# Patient Record
Sex: Male | Born: 1965
Health system: Southern US, Community
[De-identification: ages and names within clinical notes are randomized; demographics above are authoritative.]

## PROBLEM LIST (undated history)

## (undated) DIAGNOSIS — E785 Hyperlipidemia, unspecified: Secondary | ICD-10-CM

## (undated) DIAGNOSIS — R7309 Other abnormal glucose: Secondary | ICD-10-CM

## (undated) DIAGNOSIS — Z87442 Personal history of urinary calculi: Secondary | ICD-10-CM

## (undated) DIAGNOSIS — F329 Major depressive disorder, single episode, unspecified: Secondary | ICD-10-CM

## (undated) DIAGNOSIS — I10 Essential (primary) hypertension: Secondary | ICD-10-CM

## (undated) DIAGNOSIS — K219 Gastro-esophageal reflux disease without esophagitis: Secondary | ICD-10-CM

## (undated) DIAGNOSIS — F419 Anxiety disorder, unspecified: Secondary | ICD-10-CM

## (undated) DIAGNOSIS — N200 Calculus of kidney: Secondary | ICD-10-CM

## (undated) HISTORY — DX: Personal history of urinary calculi: Z87.442

## (undated) HISTORY — PX: CYSTOSCOPY W/ URETERAL STENT PLACEMENT: SHX1429

## (undated) HISTORY — DX: Hyperlipidemia, unspecified: E78.5

## (undated) HISTORY — DX: Other abnormal glucose: R73.09

## (undated) HISTORY — DX: Major depressive disorder, single episode, unspecified: F32.9

## (undated) HISTORY — PX: OTHER SURGICAL HISTORY: SHX169

## (undated) HISTORY — PX: WISDOM TOOTH EXTRACTION: SHX21

## (undated) HISTORY — DX: Essential (primary) hypertension: I10

## (undated) HISTORY — PX: LITHOTRIPSY: SUR834

## (undated) HISTORY — DX: Gastro-esophageal reflux disease without esophagitis: K21.9

## (undated) HISTORY — DX: Anxiety disorder, unspecified: F41.9

---
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
## 2009-09-08 ENCOUNTER — Ambulatory Visit: Payer: Self-pay | Admitting: Family Medicine

## 2009-09-08 DIAGNOSIS — F3289 Other specified depressive episodes: Secondary | ICD-10-CM

## 2009-09-08 DIAGNOSIS — F329 Major depressive disorder, single episode, unspecified: Secondary | ICD-10-CM

## 2009-09-08 DIAGNOSIS — I10 Essential (primary) hypertension: Secondary | ICD-10-CM | POA: Insufficient documentation

## 2009-09-08 DIAGNOSIS — Z87442 Personal history of urinary calculi: Secondary | ICD-10-CM | POA: Insufficient documentation

## 2009-09-08 DIAGNOSIS — E785 Hyperlipidemia, unspecified: Secondary | ICD-10-CM | POA: Insufficient documentation

## 2009-09-08 DIAGNOSIS — K219 Gastro-esophageal reflux disease without esophagitis: Secondary | ICD-10-CM | POA: Insufficient documentation

## 2009-09-08 DIAGNOSIS — F339 Major depressive disorder, recurrent, unspecified: Secondary | ICD-10-CM | POA: Insufficient documentation

## 2009-09-08 DIAGNOSIS — R7303 Prediabetes: Secondary | ICD-10-CM | POA: Insufficient documentation

## 2009-09-08 DIAGNOSIS — R7309 Other abnormal glucose: Secondary | ICD-10-CM

## 2009-09-08 HISTORY — DX: Essential (primary) hypertension: I10

## 2009-09-08 HISTORY — DX: Personal history of urinary calculi: Z87.442

## 2009-09-08 HISTORY — DX: Gastro-esophageal reflux disease without esophagitis: K21.9

## 2009-09-08 HISTORY — DX: Other specified depressive episodes: F32.89

## 2009-09-08 HISTORY — DX: Hyperlipidemia, unspecified: E78.5

## 2009-09-08 HISTORY — DX: Other abnormal glucose: R73.09

## 2009-09-08 HISTORY — DX: Major depressive disorder, single episode, unspecified: F32.9

## 2009-09-10 ENCOUNTER — Ambulatory Visit: Payer: Self-pay | Admitting: Family Medicine

## 2009-10-01 ENCOUNTER — Ambulatory Visit: Payer: Self-pay | Admitting: Family Medicine

## 2009-10-05 LAB — CONVERTED CEMR LAB
ALT: 36 units/L (ref 0–53)
AST: 41 units/L — ABNORMAL HIGH (ref 0–37)
Albumin: 4.2 g/dL (ref 3.5–5.2)
Alkaline Phosphatase: 64 units/L (ref 39–117)
Cholesterol: 254 mg/dL — ABNORMAL HIGH (ref 0–200)
Total CHOL/HDL Ratio: 7
Triglycerides: 515 mg/dL — ABNORMAL HIGH (ref 0.0–149.0)

## 2009-11-06 ENCOUNTER — Telehealth: Payer: Self-pay | Admitting: Family Medicine

## 2010-01-13 ENCOUNTER — Ambulatory Visit: Payer: Self-pay | Admitting: Family Medicine

## 2010-01-14 LAB — CONVERTED CEMR LAB
Albumin: 4.2 g/dL (ref 3.5–5.2)
Alkaline Phosphatase: 64 units/L (ref 39–117)
HDL: 28.7 mg/dL — ABNORMAL LOW (ref >39.00)
LDL Cholesterol: 82 mg/dL (ref 0–99)
Total Bilirubin: 0.7 mg/dL (ref 0.3–1.2)
Total CHOL/HDL Ratio: 5
Triglycerides: 189 mg/dL — ABNORMAL HIGH (ref 0.0–149.0)

## 2010-06-15 NOTE — Assessment & Plan Note (Signed)
Summary: NEW TO EST//CCM   Vital Signs:  Patient profile:   45 year old Corey [name] Height:      71 inches Weight:      208 pounds BMI:     29.11 Temp:     98.6 degrees F oral Pulse rate:   76 / minute Resp:     14 per minute BP sitting:   140 / 90  (left arm)  Vitals Entered By: Willy Eddy, LPN (September 08, 2009 10:23 AM)  Nutrition Counseling: Patient's BMI is greater than 25 and therefore counseled on weight management options.  Serial Vital Signs/Assessments:  Time      Position  BP       Pulse  Resp  Temp     By                     122/72                         [name] MD  CC: new to establish-was gioingto eagle   History of Present Illness: New patient to establish care.  History hypertension treated with benazepril 20 mg daily. Blood pressure well-controlled by home readings. No dizziness or palpitations recently.  History of dyslipidemia with high triglycerides. Treated with Lovaza. No history of diabetes.  History of GERD controlled with lifestyle measures. Occasionally supplements with over-the-counter antacids. Symptoms stable.  History of depression and reported generalized anxiety disorder. Treated Lexapro and doing well. Remote history of kidney stones.  also reported history of prediabetes by previous labs last year with no recent followup  family history and social history reviewed and is recorded elsewhere    Preventive Screening-Counseling & Management  Alcohol-Tobacco     Smoking Status: never  Caffeine-Diet-Exercise     Does Patient Exercise: yes      Drug Use:  no.    Past History:  Family History: Last updated: 09/08/2009 Family History High cholesterol Family History of CAD Father 41s Hx CVA grandfather  Social History: Last updated: 09/08/2009 Occupation:music teacher Single Never Smoked Alcohol use-yes Drug use-no Regular exercise-yes  Risk Factors: Exercise: yes (09/08/2009)  Risk Factors: Smoking Status: never  (09/08/2009)  Past Medical History: Depression GERD Hyperlipidemia Hypertension Nephrolithiasis, hx of  Family History: Family History High cholesterol Family History of CAD Father 79s Hx CVA grandfather  Social History: Chief Financial Officer Single Never Smoked Alcohol use-yes Drug use-no Regular exercise-yes Occupation:  employed Smoking Status:  never Drug Use:  no Does Patient Exercise:  yes  Review of Systems  The patient denies anorexia, fever, weight loss, weight gain, vision loss, decreased hearing, chest pain, syncope, dyspnea on exertion, peripheral edema, prolonged cough, headaches, hemoptysis, abdominal pain, melena, hematochezia, and severe indigestion/heartburn.    Physical Exam  General:  Well-developed,well-nourished,in no acute distress; alert,appropriate and cooperative throughout examination Ears:  External ear exam shows no significant lesions or deformities.  Otoscopic examination reveals clear canals, tympanic membranes are intact bilaterally without bulging, retraction, inflammation or discharge. Hearing is grossly normal bilaterally. Mouth:  Oral mucosa and oropharynx without lesions or exudates.  Teeth in good repair. Neck:  No deformities, masses, or tenderness noted. Lungs:  Normal respiratory effort, chest expands symmetrically. Lungs are clear to auscultation, no crackles or wheezes. Heart:  Normal rate and regular rhythm. S1 and S2 normal without gallop, murmur, click, rub or other extra sounds. Extremities:  No clubbing, cyanosis, edema, or deformity noted with normal full range of  motion of all joints.     Impression & Recommendations:  Problem # 1:  HYPERTENSION (ICD-401.9) stable.  Refill medication for one year. His updated medication list for this problem includes:    Lotensin 20 Mg Tabs (Benazepril hcl) .Marland Kitchen... 1 once daily  Problem # 2:  HYPERLIPIDEMIA (ICD-272.4) discussed exercise and wt loss and schedule labs. His updated  medication list for this problem includes:    Lovaza 1 Gm Caps (Omega-3-acid ethyl esters) .Marland Kitchen... 2 every am  Problem # 3:  GERD (ICD-530.81) Assessment: Unchanged  Problem # 4:  DEPRESSION (ICD-311) symptoms stable. His updated medication list for this problem includes:    Lexapro 10 Mg Tabs (Escitalopram oxalate) .Marland Kitchen... 1 once daily  Problem # 5:  PREDIABETES (ICD-790.29) by history.  Reassess fasting glucose.  Complete Medication List: 1)  Lexapro 10 Mg Tabs (Escitalopram oxalate) .Marland Kitchen.. 1 once daily 2)  Lotensin 20 Mg Tabs (Benazepril hcl) .Marland Kitchen.. 1 once daily 3)  Lovaza 1 Gm Caps (Omega-3-acid ethyl esters) .... 2 every am  Patient Instructions: 1)  Schedule the following labs: 2)  Lipid  272.4 3)  Hepatic 272.4 4)  Glucose  790.29 5)  It is important that you exercise reguarly at least 20 minutes 5 times a week. If you develop chest pain, have severe difficulty breathing, or feel very tired, stop exercising immediately and seek medical attention.  6)  You need to lose weight. Consider a lower calorie diet and regular exercise.  Prescriptions: LOTENSIN 20 MG TABS (BENAZEPRIL HCL) 1 once daily  #90 x 3   Entered and Authorized by:   [name] MD   Signed by:   [name] MD on 09/08/2009   Method used:   Electronically to        [facility]  [phone]* (retail)       [address] [place], [place]  [address]       Ph: [phone] or [phone]       Fax: [phone]   RxID:   [id number]    Immunization History:  Tetanus/Td Immunization History:    Tetanus/Td:  historical (05/16/2001)

## 2010-06-15 NOTE — Progress Notes (Signed)
Summary: Pt discontinue Simvastatin due to side effects he's experiencing  Phone Note Call from Patient Call back at Home Phone [phone]   Caller: Patient Summary of Call: Pt called and said that he is having side effects from taking Simvastatin.  Pt feels very weak and dizzy when taking this medication. Pt has discontinued med 4 days ago and is wanting to know if there is an alternative? Initial call taken by: Lucy Antigua,  November 06, 2009 3:08 PM  Follow-up for Phone Call        start Lipitor 10 mg by mouth once daily and repeat lipids and hepatic in 2 months. Follow-up by: [name] MD,  November 06, 2009 6:10 PM  Additional Follow-up for Phone Call Additional follow up Details #1::        Pt informed, he will call for lab visit in 2 months Additional Follow-up by: Sid Falcon LPN,  November 09, 2009 1:39 PM    New/Updated Medications: LIPITOR 10 MG TABS (ATORVASTATIN CALCIUM) once daily Prescriptions: LIPITOR 10 MG TABS (ATORVASTATIN CALCIUM) once daily  #30 x 3   Entered by:   Sid Falcon LPN   Authorized by:   [name] MD   Signed by:   Sid Falcon LPN on 30/86/5784   Method used:   Electronically to        [facility]  [phone]* (retail)       [address] [place], [place]  [address]       Ph: [phone] or [phone]       Fax: [phone]   RxID:   [id number]

## 2010-06-15 NOTE — Assessment & Plan Note (Signed)
Summary: rash on face/blisters on genitals/cjr   Vital Signs:  Patient profile:   45 year old male Temp:     99.2 degrees F oral BP sitting:   160 / 102  (left arm) Cuff size:   regular  Vitals Entered By: Sid Falcon LPN (September 10, 2009 2:56 PM) CC: Rash on face, genitals   History of Present Illness: Acute visit for pruritic rash right face, right hand and penis. This started couple days ago. Had been working with some plants outdoors. Prior history of contact dermatitis. No associated pain. No fevers or chills. Has not tried anything for alleviating.  Allergies (verified): No Known Drug Allergies  Past History:  Past Medical History: Last updated: 09/08/2009 Depression GERD Hyperlipidemia Hypertension Nephrolithiasis, hx of  Review of Systems  The patient denies fever and headaches.    Physical Exam  General:  Well-developed,well-nourished,in no acute distress; alert,appropriate and cooperative throughout examination Skin:  patient has rash in patch right face, right middle finger, and penis all similar appearance with erythematous base and vesicular surface. Nontender.   Impression & Recommendations:  Problem # 1:  RHUS DERMATITIS (ICD-692.6)  write for prednisone taper.  Follow up if signs of secondary infection.  His updated medication list for this problem includes:    Prednisone 10 Mg Tabs (Prednisone) .Marland Kitchen... Taper as follows:  6-5-4-4-4-3-3-2-2-1-1  Complete Medication List: 1)  Lexapro 10 Mg Tabs (Escitalopram oxalate) .Marland Kitchen.. 1 once daily 2)  Lotensin 20 Mg Tabs (Benazepril hcl) .Marland Kitchen.. 1 once daily 3)  Lovaza 1 Gm Caps (Omega-3-acid ethyl esters) .... 2 every am 4)  Prednisone 10 Mg Tabs (Prednisone) .... Taper as follows:  6-5-4-4-4-3-3-2-2-1-1  Patient Instructions: 1)  touch base if rash not resolving over the next couple of weeks Prescriptions: PREDNISONE 10 MG TABS (PREDNISONE) taper as follows:  6-5-4-4-4-3-3-2-2-1-1  #35 x 0   Entered and  Authorized by:   [name] MD   Signed by:   [name] MD on 09/10/2009   Method used:   Print then Give to Patient   RxID:   [id number]

## 2010-07-20 ENCOUNTER — Other Ambulatory Visit: Payer: Self-pay | Admitting: Family Medicine

## 2010-09-17 ENCOUNTER — Other Ambulatory Visit: Payer: Self-pay | Admitting: Family Medicine

## 2010-10-20 ENCOUNTER — Other Ambulatory Visit (INDEPENDENT_AMBULATORY_CARE_PROVIDER_SITE_OTHER): Payer: 59

## 2010-10-20 DIAGNOSIS — T887XXA Unspecified adverse effect of drug or medicament, initial encounter: Secondary | ICD-10-CM

## 2010-10-20 DIAGNOSIS — E785 Hyperlipidemia, unspecified: Secondary | ICD-10-CM

## 2010-10-20 LAB — HEPATIC FUNCTION PANEL
ALT: 32 U/L (ref 0–53)
AST: 22 U/L (ref 0–37)
Alkaline Phosphatase: 81 U/L (ref 39–117)
Total Bilirubin: 0.2 mg/dL — ABNORMAL LOW (ref 0.3–1.2)

## 2010-10-20 LAB — LIPID PANEL
Total CHOL/HDL Ratio: 6
VLDL: 121.6 mg/dL — ABNORMAL HIGH (ref 0.0–40.0)

## 2010-10-21 NOTE — Progress Notes (Signed)
Quick Note:  Pt informed on VM ______ 

## 2010-10-27 ENCOUNTER — Encounter: Payer: Self-pay | Admitting: Family Medicine

## 2010-10-28 ENCOUNTER — Encounter: Payer: Self-pay | Admitting: Family Medicine

## 2010-10-28 ENCOUNTER — Ambulatory Visit (INDEPENDENT_AMBULATORY_CARE_PROVIDER_SITE_OTHER): Payer: 59 | Admitting: Family Medicine

## 2010-10-28 VITALS — BP 120/88 | HR 72 | Temp 98.6°F | Resp 12 | Ht 68.5 in | Wt 211.0 lb

## 2010-10-28 DIAGNOSIS — Z299 Encounter for prophylactic measures, unspecified: Secondary | ICD-10-CM

## 2010-10-28 DIAGNOSIS — E785 Hyperlipidemia, unspecified: Secondary | ICD-10-CM

## 2010-10-28 LAB — GLUCOSE, POCT (MANUAL RESULT ENTRY): POC Glucose: 94

## 2010-10-28 MED ORDER — ATORVASTATIN CALCIUM 10 MG PO TABS: 10.0000 mg | ORAL_TABLET | Freq: Every day | ORAL | Status: DC

## 2010-10-28 MED ORDER — TETANUS-DIPHTH-ACELL PERTUSSIS 5-2.5-18.5 LF-MCG/0.5 IM SUSP: 0.5000 mL | Freq: Once | INTRAMUSCULAR | Status: DC

## 2010-10-28 NOTE — Progress Notes (Signed)
  Subjective:    Patient ID: Corey [name], male    DOB: 1966-02-08, 45 y.o.   MRN: [id number]  HPI Patient seen for complete physical examination. His medical problems include history of hyperlipidemia, anxiety, hypertension, GERD, and reported prediabetes. Probable metabolic syndrome with high triglyceride, low HDL, hypertension, and possible insulin resistance. No family history of premature CAD.  Patient nonsmoker. No regular alcohol use. No regular exercise. Last tetanus 2004. No history of Tdap.  Review of Systems  Constitutional: Negative for fever, activity change, appetite change and fatigue.  HENT: Negative for ear pain, congestion and trouble swallowing.   Eyes: Negative for pain and visual disturbance.  Respiratory: Negative for cough, shortness of breath and wheezing.   Cardiovascular: Negative for chest pain and palpitations.  Gastrointestinal: Negative for nausea, vomiting, abdominal pain, diarrhea, constipation, blood in stool, abdominal distention and rectal pain.  Genitourinary: Negative for dysuria, hematuria and testicular pain.  Musculoskeletal: Negative for joint swelling and arthralgias.  Skin: Negative for rash.  Neurological: Negative for dizziness, syncope and headaches.  Hematological: Negative for adenopathy.  Psychiatric/Behavioral: Negative for confusion and dysphoric mood.       Objective:   Physical Exam  Constitutional: He is oriented to person, place, and time. He appears well-developed and well-nourished. No distress.  HENT:  Head: Normocephalic and atraumatic.  Right Ear: External ear normal.  Left Ear: External ear normal.  Mouth/Throat: Oropharynx is clear and moist.  Eyes: Conjunctivae and EOM are normal. Pupils are equal, round, and reactive to light.  Neck: Normal range of motion. Neck supple. No thyromegaly present.  Cardiovascular: Normal rate, regular rhythm and normal heart sounds.   No murmur heard. Pulmonary/Chest: No respiratory  distress. He has no wheezes. He has no rales.  Abdominal: Soft. Bowel sounds are normal. He exhibits no distension and no mass. There is no tenderness. There is no rebound and no guarding.  Genitourinary:       No testicle mass or hernia  Musculoskeletal: He exhibits no edema.  Lymphadenopathy:    He has no cervical adenopathy.  Neurological: He is alert and oriented to person, place, and time. He displays normal reflexes. No cranial nerve deficit.  Skin: No rash noted.  Psychiatric: He has a normal mood and affect.          Assessment & Plan:  Complete physical. Labs reviewed with patient. Work on weight loss. Establish more consistent exercise. Consider omega-3 supplement

## 2010-10-28 NOTE — Patient Instructions (Addendum)
Metabolic Syndrome - Adult "Metabolic syndrome" is a term that refers to a group of risk factors for heart disease and diabetes. This syndrome has other names including Insulin Resistance Syndrome. The more risk factors you have, the higher your risk of having a heart attack, stroke, or developing diabetes. These risk factors include:  High blood sugar.   High blood triglyceride (a fat found in the blood) level.   High blood pressure.   Abdominal obesity (your extra weight is around your waist instead of your hips).   Low levels of HDL ("good" blood cholesterol).  If you have any three of these risk factors, you have "metabolic syndrome." If you have even one of these factors, you should make lifestyle changes to improve your health in order to prevent serious health diseases.  In people with "metabolic syndrome or insulin resistance," the cells do not respond properly to insulin. This can lead to high levels of glucose in the blood, which can interfere with normal body processes. Eventually, this can cause high blood pressure and higher fat levels in the blood, and inflammation of your blood vessels. The result can be heart disease and stroke.  CAUSES  Eating a diet rich in calories and saturated fat.   Too little physical activity.   Being overweight.  Other underlying causes are:  Family history (genetics).   Ethnicity (South Asians are at a higher risk).   Older age (your chances of developing metabolic syndrome are higher as you grow older).   Insulin resistance (see above).  SYMPTOMS By itself, metabolic syndrome has no symptoms. However, you might have symptoms of diabetes (high blood sugar) or high blood pressure, such as:  Increased thirst, urination, and tiredness.   Dizzy spells.   Dull headaches that are unusual for you.   Blurred vision.   Nosebleeds.  DIAGNOSIS Your caregiver may make a diagnosis of metabolic syndrome if you have at least three of these  factors:  If you are overweight mostly around the waist. This means a waistline greater than 40" in men and more than 35" in women. The waistline limits are 31-35 inches for women and 37-39 inches for men. In those who have certain genetic risk factors, such as having a family history of diabetes or being of Asian descent.   If you have a blood pressure of 130/85 mm Hg or more, or if you are being treated for high blood pressure.   If your blood triglyceride level is 150 mg/dL or more, or you are being treated for high levels of triglyceride.   If the level of HDL (high-density lipoprotein - "good" cholesterol) in your blood is below 40 mg/dL in men, less than 50 mg/dL in women, or you are receiving treatment for low levels of high-density lipoprotein.   If the level of sugar in your blood is high with fasting blood sugar level of 110 mg/dL or more, or you are under treatment for diabetes.  TREATMENT Your caregiver may advise lifestyle changes along with medications. Being more physically active, losing weight, changes in everyday diet, eating less food and not smoking are the key changes needed to reduce your risk for heart disease and stroke. Medicines may also be prescribed by your caregiver to help your body respond to insulin better and to reduce your blood pressure and blood fat levels. Aspirin may be recommended to reduce risks of heart disease or stroke.   Exercise: 30 minutes of brisk walking daily is recommended. An exercise program can  be designed around physical limitations and/or medical problems.   Losing weight: This can help to reduce sugar levels in your blood, lower your blood pressure, and decrease your risk of diabetes.   Healthy eating: Eating the right type of food and reducing the amount of food that is eaten everyday is important. The goal is to reduce unhealthy fats and increasing the amount of fruits, vegetables, fish, and whole grains in your diet. This helps to reduce  your risks.   Stop smoking: Your caregiver will likely advise you to stop smoking. Smoking increases your body's resistance to insulin and has other adverse health consequences.  HOME CARE INSTRUCTIONS  Permanent changes in what you eat and how much you eat everyday will likely lead to weight loss. Regular exercise as noted above.   Measure your waist at regular intervals just above the hipbones after you have breathed out.   Eat fruits, such as apples, oranges, and pears.   Eat vegetables.   Eat legumes, such as kidney beans, peas, and lentils.   Eat food rich in soluble fiber, such as whole grain cereal, oatmeal, and oat bran.   Use olive or safflower oils and avoid saturated fats.   Eat nuts.   Limit the amount of salt you eat or add to food.   Limit the amount of alcohol.   Include fish in your diet, if possible.   Stop smoking if you are a smoker.   Follow your caregiver's advice.   Maintain regular follow-up appointments.  SEEK MEDICAL CARE IF:  You feel very tired or fatigued.   You develop excessive thirst.   You pass large quantities of urine.   You are putting on weight around your waist rather than losing weight.   You develop headaches over and over again.   You have off-and-on dizzy spells.  SEEK IMMEDIATE MEDICAL CARE IF:  You develop nosebleeds.   You develop sudden blurred vision.   You develop sudden dizzy spells.   You develop chest pains, trouble breathing, or feel an abnormal or irregular heart beat.   You have a fainting episode.   You develop any sudden trouble speaking and/or swallowing.   You develop sudden weakness in one arm and/or one leg.  MAKE SURE YOU:   Understand these instructions.   Will watch your condition.   Will get help right away if you are not doing well or get worse.  Document Released: 08/09/2007 Document Re-Released: 04/14/2008 [facility] Patient Information 2011 [place], [place].  Establish more consistent  exercise. Consider omega-3 supplement such as fish oil or flaxseed Reduce simple sugars and white starches

## 2011-01-28 ENCOUNTER — Other Ambulatory Visit: Payer: Self-pay | Admitting: Family Medicine

## 2011-04-25 ENCOUNTER — Other Ambulatory Visit (INDEPENDENT_AMBULATORY_CARE_PROVIDER_SITE_OTHER): Payer: 59

## 2011-04-25 ENCOUNTER — Other Ambulatory Visit: Payer: Self-pay | Admitting: Family Medicine

## 2011-04-25 DIAGNOSIS — E785 Hyperlipidemia, unspecified: Secondary | ICD-10-CM

## 2011-04-25 LAB — LIPID PANEL
Cholesterol: 174 mg/dL (ref 0–200)
HDL: 35 mg/dL — ABNORMAL LOW (ref >39.00)
VLDL: 59.2 mg/dL — ABNORMAL HIGH (ref 0.0–40.0)

## 2011-04-25 LAB — HEPATIC FUNCTION PANEL
ALT: 23 U/L (ref 0–53)
Albumin: 4.1 g/dL (ref 3.5–5.2)
Alkaline Phosphatase: 85 U/L (ref 39–117)
Total Protein: 6.9 g/dL (ref 6.0–8.3)

## 2011-05-03 ENCOUNTER — Ambulatory Visit (INDEPENDENT_AMBULATORY_CARE_PROVIDER_SITE_OTHER): Payer: 59 | Admitting: Family Medicine

## 2011-05-03 ENCOUNTER — Encounter: Payer: Self-pay | Admitting: Family Medicine

## 2011-05-03 VITALS — BP 120/90 | Temp 98.7°F | Wt 206.0 lb

## 2011-05-03 DIAGNOSIS — I10 Essential (primary) hypertension: Secondary | ICD-10-CM

## 2011-05-03 DIAGNOSIS — Z23 Encounter for immunization: Secondary | ICD-10-CM

## 2011-05-03 DIAGNOSIS — F3289 Other specified depressive episodes: Secondary | ICD-10-CM

## 2011-05-03 DIAGNOSIS — F329 Major depressive disorder, single episode, unspecified: Secondary | ICD-10-CM

## 2011-05-03 DIAGNOSIS — E785 Hyperlipidemia, unspecified: Secondary | ICD-10-CM

## 2011-05-03 NOTE — Progress Notes (Signed)
  Subjective:    Patient ID: Corey [name], male    DOB: [birth year], 45 y.o.   MRN: [id number]  HPI  Medical followup. Patient has hyperlipidemia, hypertension, and history of depression. Depression stable on Lexapro 20 mg. No side effects. Takes Lipitor 10 mg daily. Recent lipids reviewed with patient. Still has high triglyceride and low HDL but LDL at goal which is 97 now. Lotensin 20 mg daily for hypertension. Blood pressure stable by home readings. No consistent exercise. Denies any recent dizziness. No cough or other side effect.  New symptom of sinus congestion which started this past weekend. Clear nasal mucus. Occasional upper teeth pain. No fevers or chills. Mild intermittent headache. Occasional dry cough just associated with this illness.  Review of Systems  Constitutional: Negative for fatigue.  HENT: Positive for congestion and sinus pressure. Negative for ear pain and sore throat.   Eyes: Negative for visual disturbance.  Respiratory: Positive for cough. Negative for chest tightness and shortness of breath.   Cardiovascular: Negative for chest pain, palpitations and leg swelling.  Neurological: Negative for dizziness, syncope, weakness, light-headedness and headaches.       Objective:   Physical Exam  Constitutional: He is oriented to person, place, and time. He appears well-developed and well-nourished.  HENT:  Right Ear: External ear normal.  Left Ear: External ear normal.  Mouth/Throat: Oropharynx is clear and moist.  Neck: Neck supple.  Cardiovascular: Normal rate and regular rhythm.   Pulmonary/Chest: Effort normal and breath sounds normal. No respiratory distress. He has no wheezes. He has no rales.  Musculoskeletal: He exhibits no edema.  Lymphadenopathy:    He has no cervical adenopathy.  Neurological: He is alert and oriented to person, place, and time.          Assessment & Plan:  #1 hypertension stable recommend regular exercise and weight control and  close monitoring #2 dyslipidemia. Consider omega-3 supplement 2-3 g daily and reduction of simple sugars. Continue Lipitor 10 mg daily #3 history of depression stable continue Lexapro 20 mg daily  #4 viral URI. Over-the-counter medications as needed for symptomatically. Followup as needed

## 2011-05-03 NOTE — Patient Instructions (Signed)
Try to establish regular exercise. Consider omega 3 supplement such as fish oil or flaxseed 2-3 grams per day

## 2011-08-15 ENCOUNTER — Encounter (HOSPITAL_COMMUNITY): Payer: Self-pay | Admitting: *Deleted

## 2011-08-15 ENCOUNTER — Emergency Department (HOSPITAL_COMMUNITY): Payer: 59

## 2011-08-15 ENCOUNTER — Emergency Department (HOSPITAL_COMMUNITY)
Admission: EM | Admit: 2011-08-15 | Discharge: 2011-08-15 | Disposition: A | Discharge status: Home / Self Care | Payer: 59 | Attending: Emergency Medicine | Admitting: Emergency Medicine

## 2011-08-15 DIAGNOSIS — F329 Major depressive disorder, single episode, unspecified: Secondary | ICD-10-CM | POA: Insufficient documentation

## 2011-08-15 DIAGNOSIS — K219 Gastro-esophageal reflux disease without esophagitis: Secondary | ICD-10-CM | POA: Insufficient documentation

## 2011-08-15 DIAGNOSIS — N200 Calculus of kidney: Secondary | ICD-10-CM | POA: Insufficient documentation

## 2011-08-15 DIAGNOSIS — N201 Calculus of ureter: Secondary | ICD-10-CM

## 2011-08-15 DIAGNOSIS — I10 Essential (primary) hypertension: Secondary | ICD-10-CM | POA: Insufficient documentation

## 2011-08-15 DIAGNOSIS — F3289 Other specified depressive episodes: Secondary | ICD-10-CM | POA: Insufficient documentation

## 2011-08-15 DIAGNOSIS — E785 Hyperlipidemia, unspecified: Secondary | ICD-10-CM | POA: Insufficient documentation

## 2011-08-15 HISTORY — DX: Calculus of kidney: N20.0

## 2011-08-15 LAB — URINALYSIS, ROUTINE W REFLEX MICROSCOPIC
Glucose, UA: NEGATIVE mg/dL
Specific Gravity, Urine: 1.023 (ref 1.005–1.030)
pH: 6 (ref 5.0–8.0)

## 2011-08-15 LAB — BASIC METABOLIC PANEL
BUN: 23 mg/dL (ref 6–23)
CO2: 27 mEq/L (ref 19–32)
Calcium: 9.1 mg/dL (ref 8.4–10.5)
Chloride: 100 mEq/L (ref 96–112)
Creatinine, Ser: 1.51 mg/dL — ABNORMAL HIGH (ref 0.50–1.35)
GFR calc Af Amer: 63 mL/min — ABNORMAL LOW (ref >90)
GFR calc non Af Amer: 55 mL/min — ABNORMAL LOW (ref >90)
Glucose, Bld: 159 mg/dL — ABNORMAL HIGH (ref 70–99)
Potassium: 3.6 mEq/L (ref 3.5–5.1)
Sodium: 137 mEq/L (ref 135–145)

## 2011-08-15 LAB — DIFFERENTIAL
Basophils Absolute: 0 10*3/uL (ref 0.0–0.1)
Basophils Relative: 0 % (ref 0–1)
Eosinophils Absolute: 0.1 10*3/uL (ref 0.0–0.7)
Eosinophils Relative: 1 % (ref 0–5)
Lymphocytes Relative: 27 % (ref 12–46)
Lymphs Abs: 3.2 10*3/uL (ref 0.7–4.0)
Monocytes Absolute: 1 10*3/uL (ref 0.1–1.0)
Monocytes Relative: 8 % (ref 3–12)
Neutro Abs: 7.5 10*3/uL (ref 1.7–7.7)
Neutrophils Relative %: 64 % (ref 43–77)

## 2011-08-15 LAB — CBC
MCV: 86.2 fL (ref 78.0–100.0)
Platelets: 251 10*3/uL (ref 150–400)
RBC: 4.93 MIL/uL (ref 4.22–5.81)
WBC: 11.8 10*3/uL — ABNORMAL HIGH (ref 4.0–10.5)

## 2011-08-15 LAB — URINE MICROSCOPIC-ADD ON

## 2011-08-15 MED ORDER — OXYCODONE-ACETAMINOPHEN 5-325 MG PO TABS: 1.0000 | ORAL_TABLET | ORAL | Status: AC | PRN

## 2011-08-15 MED ORDER — HYDROMORPHONE HCL PF 1 MG/ML IJ SOLN
1.0000 mg | Freq: Once | INTRAMUSCULAR | Status: AC
  Administered 2011-08-15: 1 mg via INTRAVENOUS
  Filled 2011-08-15: qty 1

## 2011-08-15 MED ORDER — SODIUM CHLORIDE 0.9 % IV SOLN
Freq: Once | INTRAVENOUS | Status: AC
  Administered 2011-08-15: 04:00:00 via INTRAVENOUS

## 2011-08-15 MED ORDER — KETOROLAC TROMETHAMINE 30 MG/ML IJ SOLN
30.0000 mg | Freq: Once | INTRAMUSCULAR | Status: AC
  Administered 2011-08-15: 30 mg via INTRAVENOUS
  Filled 2011-08-15: qty 1

## 2011-08-15 MED ORDER — TAMSULOSIN HCL 0.4 MG PO CAPS: 0.4000 mg | ORAL_CAPSULE | Freq: Every day | ORAL | Status: DC

## 2011-08-15 MED ORDER — NAPROXEN 500 MG PO TABS: 500.0000 mg | ORAL_TABLET | Freq: Two times a day (BID) | ORAL | Status: DC

## 2011-08-15 MED ORDER — ONDANSETRON HCL 4 MG/2ML IJ SOLN
4.0000 mg | Freq: Once | INTRAMUSCULAR | Status: AC
  Administered 2011-08-15: 4 mg via INTRAVENOUS
  Filled 2011-08-15: qty 2

## 2011-08-15 NOTE — ED Notes (Signed)
Pt took oxycondone at 0100am

## 2011-08-15 NOTE — ED Provider Notes (Signed)
History     CSN: [id number]  Arrival date & time 08/15/11  0209   First MD Initiated Contact with Patient 08/15/11 [phone]      Chief Complaint  Patient presents with  . Flank Pain    history of kidney stones  "feels like stone"    (Consider location/radiation/quality/duration/timing/severity/associated sxs/prior treatment) Patient is a 46 y.o. male presenting with flank pain. The history is provided by the patient.  Flank Pain  He has a history of kidney stones with the most recent one being 7 years ago. On Saturday, March 30 he had blood in his urine. Tonight at 0030 he had onset of right lower quadrant pain without radiation. Pain is dull and severe. He rates it at 8/10. Nothing makes it better nothing makes it worse. There has been no associated nausea, vomiting, dysuria, urinary urgency, urinary hesitancy. He has not had fever, chills, sweats. He states pain is similar to his prior kidney stones.  Past Medical History  Diagnosis Date  . DEPRESSION 09/08/2009  . GERD 09/08/2009  . HYPERLIPIDEMIA 09/08/2009  . HYPERTENSION 09/08/2009  . NEPHROLITHIASIS, HX OF 09/08/2009  . PREDIABETES 09/08/2009  . Kidney stones     No past surgical history on file.  Family History  Problem Relation Age of Onset  . Hyperlipidemia Neg Hx     family hx  . Heart disease Neg Hx     family  . Diabetes Father     History  Substance Use Topics  . Smoking status: Never Smoker   . Smokeless tobacco: Not on file  . Alcohol Use: Yes      Review of Systems  Genitourinary: Positive for flank pain.  All other systems reviewed and are negative.    Allergies  Review of patient's allergies indicates no known allergies.  Home Medications   Current Outpatient Rx  Name Route Sig Dispense Refill  . ATORVASTATIN CALCIUM 10 MG PO TABS Oral Take 1 tablet (10 mg total) by mouth daily. 90 tablet 3  . BENAZEPRIL HCL 20 MG PO TABS  TAKE 1 TABLET BY MOUTH ONCE A DAY 90 tablet 3  . ESCITALOPRAM OXALATE  20 MG PO TABS  TAKE 1/2 TABLET EVERY DAY 45 tablet 1    BP 134/89  Pulse 48  Temp(Src) 98.2 F (36.8 C) (Oral)  Resp 18  SpO2 100%  Physical Exam  Nursing note and vitals reviewed.  46 year old male is resting comfortably and in no acute distress. Vital signs are significant for bradycardia with heart rate of 48. Oxygen saturation is 100% which is normal. Head is normocephalic and atraumatic. PERRLA, EOMI. Oropharynx is clear. Neck is nontender and supple without adenopathy. Lungs are clear without rales, wheezes, or rhonchi. Back is nontender there's no CVA tenderness. Heart has regular rate rhythm without murmur. Abdomen is soft, flat, nontender without masses or hepatosplenomegaly. Peristalsis is present but diminished. Extremities have no cyanosis or edema, full range of motion is present. Skin is warm and dry without rash. Neurologic: Mental status is normal, cranial nerves are intact, there are no focal motor or sensory deficits.  ED Course  Procedures (including critical care time)  Results for orders placed during the hospital encounter of 08/15/11  CBC      Component Value Range   WBC 11.8 (*) 4.0 - 10.5 (K/uL)   RBC 4.93  4.22 - 5.81 (MIL/uL)   Hemoglobin 14.7  13.0 - 17.0 (g/dL)   HCT 11.9  14.7 - 82.9 (%)   MCV  86.2  78.0 - 100.0 (fL)   MCH 29.8  26.0 - 34.0 (pg)   MCHC 34.6  30.0 - 36.0 (g/dL)   RDW 11.9  14.7 - 82.9 (%)   Platelets 251  150 - 400 (K/uL)  DIFFERENTIAL      Component Value Range   Neutrophils Relative 64  43 - 77 (%)   Neutro Abs 7.5  1.7 - 7.7 (K/uL)   Lymphocytes Relative 27  12 - 46 (%)   Lymphs Abs 3.2  0.7 - 4.0 (K/uL)   Monocytes Relative 8  3 - 12 (%)   Monocytes Absolute 1.0  0.1 - 1.0 (K/uL)   Eosinophils Relative 1  0 - 5 (%)   Eosinophils Absolute 0.1  0.0 - 0.7 (K/uL)   Basophils Relative 0  0 - 1 (%)   Basophils Absolute 0.0  0.0 - 0.1 (K/uL)  BASIC METABOLIC PANEL      Component Value Range   Sodium 137  135 - 145 (mEq/L)    Potassium 3.6  3.5 - 5.1 (mEq/L)   Chloride 100  96 - 112 (mEq/L)   CO2 27  19 - 32 (mEq/L)   Glucose, Bld 159 (*) 70 - 99 (mg/dL)   BUN 23  6 - 23 (mg/dL)   Creatinine, Ser 5.62 (*) 0.50 - 1.35 (mg/dL)   Calcium 9.1  8.4 - 13.0 (mg/dL)   GFR calc non Af Amer 55 (*) >90 (mL/min)   GFR calc Af Amer 63 (*) >90 (mL/min)  URINALYSIS, ROUTINE W REFLEX MICROSCOPIC      Component Value Range   Color, Urine RED (*) YELLOW    APPearance CLOUDY (*) CLEAR    Specific Gravity, Urine 1.023  1.005 - 1.030    pH 6.0  5.0 - 8.0    Glucose, UA NEGATIVE  NEGATIVE (mg/dL)   Hgb urine dipstick LARGE (*) NEGATIVE    Bilirubin Urine NEGATIVE  NEGATIVE    Ketones, ur TRACE (*) NEGATIVE (mg/dL)   Protein, ur 30 (*) NEGATIVE (mg/dL)   Urobilinogen, UA 1.0  0.0 - 1.0 (mg/dL)   Nitrite NEGATIVE  NEGATIVE    Leukocytes, UA SMALL (*) NEGATIVE   URINE MICROSCOPIC-ADD ON      Component Value Range   RBC / HPF TOO NUMEROUS TO COUNT  <3 (RBC/hpf)   Urine-Other FIELD OBSCURED BY RBC'S     Ct Abdomen Pelvis Wo Contrast  08/15/2011  *RADIOLOGY REPORT*  Clinical Data: Right flank pain and gross hematuria.  Red and white cells in urine.  White cell count 11.8.  CT ABDOMEN AND PELVIS WITHOUT CONTRAST  Technique:  Multidetector CT imaging of the abdomen and pelvis was performed following the standard protocol without intravenous contrast.  Comparison: None.  Findings: Slight fibrosis in the lung bases.  There is a 5 mm stone in the mid right ureter at the level of L3-4. There is mild proximal pyelocaliectasis and ureterectasis with some periureteral stranding consistent with moderate obstruction.  The distal ureter is decompressed.  There are multiple punctate stones within both kidneys.  No evidence of obstruction on the left.  No bladder stones or bladder wall thickening demonstrated.  The unenhanced appearance of the liver, spleen, gallbladder, pancreas, adrenal glands, abdominal aorta, and retroperitoneal lymph nodes is  unremarkable.  The stomach and small bowel are decompressed.  Stool filled colon without distension.  No free air or free fluid in the abdomen.  Pelvis:  Mild prominence of prostate gland measuring 5.1 x 4.8  cm transverse dimension.  No bladder wall thickening.  No free or loculated pelvic fluid collections.  Small left inguinal hernia containing fat.  The appendix is not specifically identified but there are no inflammatory changes in the right lower quadrant.  No inflammatory changes in the sigmoid region.  Normal alignment of the lumbar vertebrae.  IMPRESSION: 5 mm moderately obstructing stone in the mid right ureter. Multiple punctate nonobstructing stones throughout both kidneys.  Original Report Authenticated By: Marlon Pel, M.D.    He got good relief of pain with IV fluids, IV ketorolac, IV hydromorphone, and IV ondansetron. He will be referred to urology for followup and sent home with a prescription for Percocet, Naprosyn, and Flomax.  1. Ureterolithiasis   2. Nephrolithiasis       MDM  Probable recurrent ureterolithiasis. CT scan has been ordered and he will be given IV NSAIDs, IV narcotic, and IV anti-emetic.        Dione Booze, MD 08/15/11 0500

## 2011-08-15 NOTE — Discharge Instructions (Signed)
Kidney Stones Kidney stones (ureteral lithiasis) are deposits that form inside your kidneys. The intense pain is caused by the stone moving through the urinary tract. When the stone moves, the ureter goes into spasm around the stone. The stone is usually passed in the urine.  CAUSES   A disorder that makes certain neck glands produce too much parathyroid hormone (primary hyperparathyroidism).   A buildup of uric acid crystals.   Narrowing (stricture) of the ureter.   A kidney obstruction present at birth (congenital obstruction).   Previous surgery on the kidney or ureters.   Numerous kidney infections.  SYMPTOMS   Feeling sick to your stomach (nauseous).   Throwing up (vomiting).   Blood in the urine (hematuria).   Pain that usually spreads (radiates) to the groin.   Frequency or urgency of urination.  DIAGNOSIS   Taking a history and physical exam.   Blood or urine tests.   Computerized X-ray scan (CT scan).   Occasionally, an examination of the inside of the urinary bladder (cystoscopy) is performed.  TREATMENT   Observation.   Increasing your fluid intake.   Surgery may be needed if you have severe pain or persistent obstruction.  The size, location, and chemical composition are all important variables that will determine the proper choice of action for you. Talk to your caregiver to better understand your situation so that you will minimize the risk of injury to yourself and your kidney.  HOME CARE INSTRUCTIONS   Drink enough water and fluids to keep your urine clear or pale yellow.   Strain all urine through the provided strainer. Keep all particulate matter and stones for your caregiver to see. The stone causing the pain may be as small as a grain of salt. It is very important to use the strainer each and every time you pass your urine. The collection of your stone will allow your caregiver to analyze it and verify that a stone has actually passed.   Only take  over-the-counter or prescription medicines for pain, discomfort, or fever as directed by your caregiver.   Make a follow-up appointment with your caregiver as directed.   Get follow-up X-rays if required. The absence of pain does not always mean that the stone has passed. It may have only stopped moving. If the urine remains completely obstructed, it can cause loss of kidney function or even complete destruction of the kidney. It is your responsibility to make sure X-rays and follow-ups are completed. Ultrasounds of the kidney can show blockages and the status of the kidney. Ultrasounds are not associated with any radiation and can be performed easily in a matter of minutes.  SEEK IMMEDIATE MEDICAL CARE IF:   Pain cannot be controlled with the prescribed medicine.   You have a fever.   The severity or intensity of pain increases over 18 hours and is not relieved by pain medicine.   You develop a new onset of abdominal pain.   You feel faint or pass out.  MAKE SURE YOU:   Understand these instructions.   Will watch your condition.   Will get help right away if you are not doing well or get worse.  Document Released: 05/02/2005 Document Revised: 04/21/2011 Document Reviewed: 08/28/2009 [facility] Patient Information 2012 [place], [place].  Acetaminophen; Oxycodone tablets What is this medicine? ACETAMINOPHEN; OXYCODONE (a set a MEE noe fen; ox i KOE done) is a pain reliever. It is used to treat mild to moderate pain. This medicine may be used  for other purposes; ask your health care provider or pharmacist if you have questions. What should I tell my health care provider before I take this medicine? They need to know if you have any of these conditions: -brain tumor -Crohn's disease, inflammatory bowel disease, or ulcerative colitis -drink more than 3 alcohol containing drinks per day -drug abuse or addiction -head injury -heart or circulation problems -kidney disease or problems  going to the bathroom -liver disease -lung disease, asthma, or breathing problems -an unusual or allergic reaction to acetaminophen, oxycodone, other opioid analgesics, other medicines, foods, dyes, or preservatives -pregnant or trying to get pregnant -breast-feeding How should I use this medicine? Take this medicine by mouth with a full glass of water. Follow the directions on the prescription label. Take your medicine at regular intervals. Do not take your medicine more often than directed. Talk to your pediatrician regarding the use of this medicine in children. Special care may be needed. Patients over 76 years old may have a stronger reaction and need a smaller dose. Overdosage: If you think you have taken too much of this medicine contact a poison control center or emergency room at once. NOTE: This medicine is only for you. Do not share this medicine with others. What if I miss a dose? If you miss a dose, take it as soon as you can. If it is almost time for your next dose, take only that dose. Do not take double or extra doses. What may interact with this medicine? -alcohol or medicines that contain alcohol -antihistamines -barbiturates like amobarbital, butalbital, butabarbital, methohexital, pentobarbital, phenobarbital, thiopental, and secobarbital -benztropine -drugs for bladder problems like solifenacin, trospium, oxybutynin, tolterodine, hyoscyamine, and methscopolamine -drugs for breathing problems like ipratropium and tiotropium -drugs for certain stomach or intestine problems like propantheline, homatropine methylbromide, glycopyrrolate, atropine, belladonna, and dicyclomine -general anesthetics like etomidate, ketamine, nitrous oxide, propofol, desflurane, enflurane, halothane, isoflurane, and sevoflurane -medicines for depression, anxiety, or psychotic disturbances -medicines for pain like codeine, morphine, pentazocine, buprenorphine, butorphanol, nalbuphine, tramadol, and  propoxyphene -medicines for sleep -muscle relaxants -naltrexone -phenothiazines like perphenazine, thioridazine, chlorpromazine, mesoridazine, fluphenazine, prochlorperazine, promazine, and trifluoperazine -scopolamine -trihexyphenidyl This list may not describe all possible interactions. Give your health care provider a list of all the medicines, herbs, non-prescription drugs, or dietary supplements you use. Also tell them if you smoke, drink alcohol, or use illegal drugs. Some items may interact with your medicine. What should I watch for while using this medicine? Tell your doctor or health care professional if your pain does not go away, if it gets worse, or if you have new or a different type of pain. You may develop tolerance to the medicine. Tolerance means that you will need a higher dose of the medication for pain relief. Tolerance is normal and is expected if you take this medicine for a long time. Do not suddenly stop taking your medicine because you may develop a severe reaction. Your body becomes used to the medicine. This does NOT mean you are addicted. Addiction is a behavior related to getting and using a drug for a nonmedical reason. If you have pain, you have a medical reason to take pain medicine. Your doctor will tell you how much medicine to take. If your doctor wants you to stop the medicine, the dose will be slowly lowered over time to avoid any side effects. You may get drowsy or dizzy. Do not drive, use machinery, or do anything that needs mental alertness until you know how this  medicine affects you. Do not stand or sit up quickly, especially if you are an older patient. This reduces the risk of dizzy or fainting spells. Alcohol may interfere with the effect of this medicine. Avoid alcoholic drinks. The medicine will cause constipation. Try to have a bowel movement at least every 2 to 3 days. If you do not have a bowel movement for 3 days, call your doctor or health care  professional. Do not take Tylenol (acetaminophen) or medicines that have acetaminophen with this medicine. Too much acetaminophen can be very dangerous. Many nonprescription medicines contain acetaminophen. Always read the labels carefully to avoid taking more acetaminophen. What side effects may I notice from receiving this medicine? Side effects that you should report to your doctor or health care professional as soon as possible: -allergic reactions like skin rash, itching or hives, swelling of the face, lips, or tongue -breathing difficulties, wheezing -confusion -light headedness or fainting spells -severe stomach pain -yellowing of the skin or the whites of the eyes Side effects that usually do not require medical attention (report to your doctor or health care professional if they continue or are bothersome): -dizziness -drowsiness -nausea -vomiting This list may not describe all possible side effects. Call your doctor for medical advice about side effects. You may report side effects to FDA at 1-800-FDA-1088. Where should I keep my medicine? Keep out of the reach of children. This medicine can be abused. Keep your medicine in a safe place to protect it from theft. Do not share this medicine with anyone. Selling or giving away this medicine is dangerous and against the law. Store at room temperature between 20 and 25 degrees C (68 and 77 degrees F). Keep container tightly closed. Protect from light. Flush any unused medicines down the toilet. Do not use the medicine after the expiration date. NOTE: This sheet is a summary. It may not cover all possible information. If you have questions about this medicine, talk to your doctor, pharmacist, or health care provider.  2012, [employer]. (03/31/2008 10:01:21 AM)  Naproxen and naproxen sodium oral immediate-release tablets What is this medicine? NAPROXEN (na PROX en) is a non-steroidal anti-inflammatory drug (NSAID). It is used  to reduce swelling and to treat pain. This medicine may be used for dental pain, headache, or painful monthly periods. It is also used for painful joint and muscular problems such as arthritis, tendinitis, bursitis, and gout. This medicine may be used for other purposes; ask your health care provider or pharmacist if you have questions. What should I tell my health care provider before I take this medicine? They need to know if you have any of these conditions: -asthma -cigarette smoker -drink more than 3 alcohol containing drinks a day -heart disease or circulation problems such as heart failure or leg edema (fluid retention) -high blood pressure -kidney disease -liver disease -stomach bleeding or ulcers -an unusual or allergic reaction to naproxen, aspirin, other NSAIDs, other medicines, foods, dyes, or preservatives -pregnant or trying to get pregnant -breast-feeding How should I use this medicine? Take this medicine by mouth with a glass of water. Follow the directions on the prescription label. Take it with food if your stomach gets upset. Try to not lie down for at least 10 minutes after you take it. Take your medicine at regular intervals. Do not take your medicine more often than directed. Long-term, continuous use may increase the risk of heart attack or stroke. A special MedGuide will be given to you by the  pharmacist with each prescription and refill. Be sure to read this information carefully each time. Talk to your pediatrician regarding the use of this medicine in children. Special care may be needed. Overdosage: If you think you have taken too much of this medicine contact a poison control center or emergency room at once. NOTE: This medicine is only for you. Do not share this medicine with others. What if I miss a dose? If you miss a dose, take it as soon as you can. If it is almost time for your next dose, take only that dose. Do not take double or extra doses. What may  interact with this medicine? -alcohol -aspirin -cidofovir -diuretics -lithium -methotrexate -other drugs for inflammation like ketorolac or prednisone -pemetrexed -probenecid -warfarin This list may not describe all possible interactions. Give your health care provider a list of all the medicines, herbs, non-prescription drugs, or dietary supplements you use. Also tell them if you smoke, drink alcohol, or use illegal drugs. Some items may interact with your medicine. What should I watch for while using this medicine? Tell your doctor or health care professional if your pain does not get better. Talk to your doctor before taking another medicine for pain. Do not treat yourself. This medicine does not prevent heart attack or stroke. In fact, this medicine may increase the chance of a heart attack or stroke. The chance may increase with longer use of this medicine and in people who have heart disease. If you take aspirin to prevent heart attack or stroke, talk with your doctor or health care professional. Do not take other medicines that contain aspirin, ibuprofen, or naproxen with this medicine. Side effects such as stomach upset, nausea, or ulcers may be more likely to occur. Many medicines available without a prescription should not be taken with this medicine. This medicine can cause ulcers and bleeding in the stomach and intestines at any time during treatment. Do not smoke cigarettes or drink alcohol. These increase irritation to your stomach and can make it more susceptible to damage from this medicine. Ulcers and bleeding can happen without warning symptoms and can cause death. You may get drowsy or dizzy. Do not drive, use machinery, or do anything that needs mental alertness until you know how this medicine affects you. Do not stand or sit up quickly, especially if you are an older patient. This reduces the risk of dizzy or fainting spells. This medicine can cause you to bleed more easily.  Try to avoid damage to your teeth and gums when you brush or floss your teeth. What side effects may I notice from receiving this medicine? Side effects that you should report to your doctor or health care professional as soon as possible: -black or bloody stools, blood in the urine or vomit -blurred vision -chest pain -difficulty breathing or wheezing -nausea or vomiting -severe stomach pain -skin rash, skin redness, blistering or peeling skin, hives, or itching -slurred speech or weakness on one side of the body -swelling of eyelids, throat, lips -unexplained weight gain or swelling -unusually weak or tired -yellowing of eyes or skin Side effects that usually do not require medical attention (report to your doctor or health care professional if they continue or are bothersome): -constipation -headache -heartburn This list may not describe all possible side effects. Call your doctor for medical advice about side effects. You may report side effects to FDA at 1-800-FDA-1088. Where should I keep my medicine? Keep out of the reach of children. Store at  room temperature between 15 and 30 degrees C (59 and 86 degrees F). Keep container tightly closed. Throw away any unused medicine after the expiration date. NOTE: This sheet is a summary. It may not cover all possible information. If you have questions about this medicine, talk to your doctor, pharmacist, or health care provider.  2012, [employer]. (05/04/2009 8:10:16 PM)  Tamsulosin capsules What is this medicine? TAMSULOSIN (tam SOO loe sin) is used to treat enlargement of the prostate gland in men, a condition called benign prostatic hyperplasia or BPH. It is not for use in women. It works by relaxing muscles in the prostate and bladder neck. This improves urine flow and reduces BPH symptoms. This medicine may be used for other purposes; ask your health care provider or pharmacist if you have questions. What should I tell my  health care provider before I take this medicine? They need to know if you have any of the following conditions: -advanced kidney disease -advanced liver disease -low blood pressure -prostate cancer -an unusual or allergic reaction to tamsulosin, sulfa drugs, other medicines, foods, dyes, or preservatives -pregnant or trying to get pregnant -breast-feeding How should I use this medicine? Take this medicine by mouth about 30 minutes after the same meal every day. Follow the directions on the prescription label. Swallow the capsules whole with a glass of water. Do not crush, chew, or open capsules. Do not take your medicine more often than directed. Do not stop taking your medicine unless your doctor tells you to. Talk to your pediatrician regarding the use of this medicine in children. Special care may be needed. Overdosage: If you think you have taken too much of this medicine contact a poison control center or emergency room at once. NOTE: This medicine is only for you. Do not share this medicine with others. What if I miss a dose? If you miss a dose, take it as soon as you can. If it is almost time for your next dose, take only that dose. Do not take double or extra doses. If you stop taking your medicine for several days or more, ask your doctor or health care professional what dose you should start back on. What may interact with this medicine? -cimetidine -fluoxetine -ketoconazole -medicines for erectile disfunction like sildenafil, tadalafil, vardenafil -medicines for high blood pressure -other alpha-blockers like alfuzosin, doxazosin, phentolamine, phenoxybenzamine, prazosin, terazosin -warfarin This list may not describe all possible interactions. Give your health care provider a list of all the medicines, herbs, non-prescription drugs, or dietary supplements you use. Also tell them if you smoke, drink alcohol, or use illegal drugs. Some items may interact with your medicine. What  should I watch for while using this medicine? Visit your doctor or health care professional for regular check ups. You will need lab work done before you start this medicine and regularly while you are taking it. Check your blood pressure as directed. Ask your health care professional what your blood pressure should be, and when you should contact him or her. This medicine may make you feel dizzy or lightheaded. This is more likely to happen after the first dose, after an increase in dose, or during hot weather or exercise. Drinking alcohol and taking some medicines can make this worse. Do not drive, use machinery, or do anything that needs mental alertness until you know how this medicine affects you. Do not sit or stand up quickly. If you begin to feel dizzy, sit down until you feel better. These effects  can decrease once your body adjusts to the medicine. Although extremely rare in men taking this medicine, contact you doctor immediately if you have a prolonged and painful erection of the penis which is unrelated to sexual activity. If you do not get medical attention, this condition can lead to permanent erectile dysfunction. If you are thinking of having cataract surgery, tell your eye surgeon that you have taken this medicine. What side effects may I notice from receiving this medicine? Side effects that you should report to your doctor or health care professional as soon as possible: -allergic reactions like skin rash or itching, hives, swelling of the lips, mouth, tongue, or throat -breathing problems -change in vision -feeling faint or lightheaded -irregular heartbeat -weakness Side effects that usually do not require medical attention (report to your doctor or health care professional if they continue or are bothersome): -back pain -change in sex drive or performance -constipation, nausea or vomiting -cough -drowsy -runny or stuffy nose -trouble sleeping This list may not describe all  possible side effects. Call your doctor for medical advice about side effects. You may report side effects to FDA at 1-800-FDA-1088. Where should I keep my medicine? Keep out of the reach of children. Store at room temperature between 15 and 30 degrees C (59 and 86 degrees F). Throw away any unused medicine after the expiration date. NOTE: This sheet is a summary. It may not cover all possible information. If you have questions about this medicine, talk to your doctor, pharmacist, or health care provider.  2012, [employer]. (07/09/2007 6:08:27 PM)

## 2011-08-17 ENCOUNTER — Other Ambulatory Visit: Payer: Self-pay | Admitting: Family Medicine

## 2011-09-20 ENCOUNTER — Other Ambulatory Visit: Payer: Self-pay | Admitting: Family Medicine

## 2011-10-17 ENCOUNTER — Other Ambulatory Visit: Payer: Self-pay | Admitting: Urology

## 2011-10-28 ENCOUNTER — Telehealth: Payer: Self-pay | Admitting: Family Medicine

## 2011-10-28 NOTE — Telephone Encounter (Signed)
I think 1 year followup is okay as long as his blood pressure has remained stable

## 2011-10-28 NOTE — Telephone Encounter (Signed)
Patient is aware 

## 2011-10-28 NOTE — Telephone Encounter (Signed)
Pt called and is sch for 6 month fup on 11/01/11. Pt wants to know if he still needs to come in that day, or does he needs to sch for 55yr fup instead? If he needs to keep 6/18 appt, does pt need to get labs done?

## 2011-11-01 ENCOUNTER — Ambulatory Visit: Payer: 59 | Admitting: Family Medicine

## 2011-11-07 ENCOUNTER — Other Ambulatory Visit: Payer: Self-pay | Admitting: Family Medicine

## 2011-11-07 ENCOUNTER — Encounter (HOSPITAL_COMMUNITY): Payer: Self-pay | Admitting: *Deleted

## 2011-11-07 NOTE — Pre-Procedure Instructions (Signed)
Patient instructed to bring blue folder,driver,insurance info, NPO after midnight except meds with a sip, no aspirin,ibuprofen etc prior to litho. Patient to arrive in [facility] at 0530 am. To follow laxative instructions in blue folder.

## 2011-11-08 ENCOUNTER — Encounter (HOSPITAL_COMMUNITY): Payer: Self-pay | Admitting: Pharmacy Technician

## 2011-11-09 NOTE — H&P (Signed)
F/u nephrolithiasis referred by Dr. Preston Fleeting. His primary care doctor is Dr. Caryl Never. He developed right lower quadrant and right flank pain proximally one week ago. The pain was moderate to severe. He was started on pain meds, naproxyn and tamsulosin (only 5 pills).   CT urogram August 15, 2011 revealed a 5 mm right proximal stone at L3-L4 level. There was mild Hydro. There were punctate bilateral stones. I reviewed the images.  Labs April 2013: White count 11.8, BUN 23, creatinine 1.5, GFR 55-63, UA to numerous to count red blood cell  He had a kidney stone about 7 years ago. He passed it.   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  KUB Aug 22 2011 - 4 mm calcification along the right sacrum consistent with ureteral stone. No renal stones.   Interval Hx He has not passed the stone. He had mild to moderate pain over past week. Pain is in RLQ. Discomfort is "moving lower".    KUB today - stone progressed about 1.5 cm toward right UVJ.   Past Medical History Problems  1. History of  Anxiety (Symptom) 300.00 2. History of  Depression 311 3. History of  Esophageal Reflux 530.81 4. History of  Hypercholesterolemia 272.0 5. History of  Hypertension 401.9  Surgical History Problems  1. History of  Oral Surgery  Current Meds 1. Atorvastatin Calcium 10 MG Oral Tablet; Therapy: (Recorded:08Apr2013) to 2. Benazepril HCl 20 MG Oral Tablet; Therapy: (Recorded:08Apr2013) to 3. Escitalopram Oxalate 20 MG Oral Tablet; TAKE 1/2 TABLET DAILY; Therapy: 03Apr2013 to 4. Oxycodone-Acetaminophen 5-325 MG Oral Tablet; Therapy: 01Apr2013 to 5. Tamsulosin HCl 0.4 MG Oral Capsule; TAKE 1 CAPSULE Daily; Therapy: 08Apr2013 to  (Evaluate:08May2013)  Requested for: 08Apr2013; Last Rx:08Apr2013  Allergies Medication  1. Naproxen TABS  Family History Problems  1. Paternal grandfather's history of  Prostate Cancer V16.42  Social History Problems  1.  Alcohol Use 2 per day 2. Caffeine Use 1-2 per day 3. Marital History - Single 4. Never A Smoker 5. 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 History of  Tobacco Use 305.1  Physical Exam Constitutional: Well nourished and well developed . No acute distress.  Pulmonary: No respiratory distress and normal respiratory rhythm and effort.  Cardiovascular: Heart rate and rhythm are normal . No peripheral edema.  Neuro/Psych:. Mood and affect are appropriate.    Results/Data Urine [Data Includes: Last 1 Day]   30May2013  COLOR YELLOW   APPEARANCE CLEAR   SPECIFIC GRAVITY 1.020   pH 5.0   GLUCOSE NEG mg/dL  BILIRUBIN NEG   KETONE NEG mg/dL  BLOOD SMALL   PROTEIN NEG mg/dL  UROBILINOGEN 0.2 mg/dL  NITRITE NEG   LEUKOCYTE ESTERASE NEG   SQUAMOUS EPITHELIAL/HPF NONE SEEN   WBC NONE SEEN WBC/hpf  RBC NONE SEEN RBC/hpf  BACTERIA NONE SEEN   CRYSTALS NONE SEEN   CASTS NONE SEEN   Other RARE AMOR[HOUS MATERIAL    Assessment Assessed  1. Nephrolithiasis 592.0 2. Ureteral Stone 592.1  Plan Health Maintenance (V70.0)  1. UA With REFLEX  Done: 30May2013 09:26AM Nephrolithiasis (592.0)  2. KUB  Done: 30May2013 12:00AM Ureteral Stone (592.1)  3. Tamsulosin HCl 0.4 MG Oral Capsule; Take one capsule daily; Therapy: 30May2013 to  (Evaluate:29Jul2013); Last Rx:30May2013 4. Follow-up Schedule Surgery Office  Follow-up  Requested for: 30May2013  Discussion/Summary  Using Kidney stone handout we discussed nature, R/B of continued stone passage with MET, URS/stent/laser or ESWL. All  questions answered. He wants to go ahead with ESWL which we will arrange.

## 2011-11-10 ENCOUNTER — Ambulatory Visit (HOSPITAL_COMMUNITY)
Admission: RE | Admit: 2011-11-10 | Discharge: 2011-11-10 | Disposition: A | Discharge status: Home / Self Care | Payer: 59 | Source: Ambulatory Visit | Attending: Urology | Admitting: Urology

## 2011-11-10 ENCOUNTER — Encounter (HOSPITAL_COMMUNITY): Payer: Self-pay

## 2011-11-10 ENCOUNTER — Encounter (HOSPITAL_COMMUNITY)
Admission: RE | Disposition: A | Discharge status: Home / Self Care | Payer: Self-pay | Source: Ambulatory Visit | Attending: Urology

## 2011-11-10 DIAGNOSIS — N200 Calculus of kidney: Secondary | ICD-10-CM | POA: Insufficient documentation

## 2011-11-10 DIAGNOSIS — K219 Gastro-esophageal reflux disease without esophagitis: Secondary | ICD-10-CM | POA: Insufficient documentation

## 2011-11-10 DIAGNOSIS — N201 Calculus of ureter: Secondary | ICD-10-CM | POA: Insufficient documentation

## 2011-11-10 DIAGNOSIS — I10 Essential (primary) hypertension: Secondary | ICD-10-CM | POA: Insufficient documentation

## 2011-11-10 DIAGNOSIS — Z538 Procedure and treatment not carried out for other reasons: Secondary | ICD-10-CM | POA: Insufficient documentation

## 2011-11-10 DIAGNOSIS — E7800 Pure hypercholesterolemia, unspecified: Secondary | ICD-10-CM | POA: Insufficient documentation

## 2011-11-10 DIAGNOSIS — Z79899 Other long term (current) drug therapy: Secondary | ICD-10-CM | POA: Insufficient documentation

## 2011-11-10 SURGERY — LITHOTRIPSY, ESWL
Anesthesia: LOCAL | Laterality: Right

## 2011-11-10 MED ORDER — DIAZEPAM 5 MG PO TABS
ORAL_TABLET | ORAL | Status: AC
  Administered 2011-11-10: 10 mg via ORAL
  Filled 2011-11-10: qty 2

## 2011-11-10 MED ORDER — DIPHENHYDRAMINE HCL 25 MG PO CAPS
ORAL_CAPSULE | ORAL | Status: AC
  Administered 2011-11-10: 25 mg via ORAL
  Filled 2011-11-10: qty 1

## 2011-11-10 MED ORDER — DIAZEPAM 5 MG PO TABS
10.0000 mg | ORAL_TABLET | ORAL | Status: AC
  Administered 2011-11-10: 10 mg via ORAL

## 2011-11-10 MED ORDER — CIPROFLOXACIN HCL 500 MG PO TABS
500.0000 mg | ORAL_TABLET | ORAL | Status: AC
  Administered 2011-11-10: 500 mg via ORAL

## 2011-11-10 MED ORDER — DEXTROSE-NACL 5-0.45 % IV SOLN
INTRAVENOUS | Status: DC
  Administered 2011-11-10: 07:00:00 via INTRAVENOUS

## 2011-11-10 MED ORDER — CIPROFLOXACIN HCL 500 MG PO TABS
ORAL_TABLET | ORAL | Status: AC
  Administered 2011-11-10: 500 mg via ORAL
  Filled 2011-11-10: qty 1

## 2011-11-10 MED ORDER — DIPHENHYDRAMINE HCL 25 MG PO CAPS
25.0000 mg | ORAL_CAPSULE | ORAL | Status: AC
  Administered 2011-11-10: 25 mg via ORAL

## 2011-11-10 NOTE — Progress Notes (Signed)
Pt instructed not to drive today

## 2011-11-10 NOTE — Progress Notes (Signed)
Lithotripsy cancelled because unable to visualize stone. Pt returned to short stay for discharge

## 2011-11-10 NOTE — Interval H&P Note (Signed)
History and Physical Interval Note:  11/10/2011 8:12 AM  Corey [name]  has presented today for surgery, with the diagnosis of Right Ureteral Stone  The stone is not clearly visible on KUB. He did have flank and RLQ discomfort last week. No ESWL performed. He will f/u in office to consider further imaging with CT.      Antony Haste

## 2011-11-10 NOTE — Progress Notes (Signed)
Patient co/o feeling fainty before IV start from PO meds given prior, then did pass out. Painful stimuli used to arouse him. Pale, cool, clammy. Became more diaphoretic as came around. Reclined and cool damp clothes placed on forehead.  Cleaned of perspiration, fresh gown and blanket given.  Responding normally in about 10 min post episode. Color good. VSS

## 2011-11-11 ENCOUNTER — Encounter (HOSPITAL_COMMUNITY): Payer: Self-pay

## 2011-11-27 ENCOUNTER — Other Ambulatory Visit: Payer: Self-pay | Admitting: Internal Medicine

## 2011-11-28 NOTE — Telephone Encounter (Signed)
Dr burchette pt 

## 2012-05-02 ENCOUNTER — Ambulatory Visit: Payer: 59 | Admitting: Family Medicine

## 2012-05-02 ENCOUNTER — Encounter: Payer: Self-pay | Admitting: Family Medicine

## 2012-05-02 ENCOUNTER — Ambulatory Visit (INDEPENDENT_AMBULATORY_CARE_PROVIDER_SITE_OTHER): Payer: 59 | Admitting: Family Medicine

## 2012-05-02 VITALS — BP 110/60 | Temp 98.2°F | Wt 216.0 lb

## 2012-05-02 DIAGNOSIS — I10 Essential (primary) hypertension: Secondary | ICD-10-CM

## 2012-05-02 DIAGNOSIS — E785 Hyperlipidemia, unspecified: Secondary | ICD-10-CM

## 2012-05-02 DIAGNOSIS — Z23 Encounter for immunization: Secondary | ICD-10-CM

## 2012-05-02 LAB — HEPATIC FUNCTION PANEL
ALT: 35 U/L (ref 0–53)
AST: 21 U/L (ref 0–37)
Albumin: 4.4 g/dL (ref 3.5–5.2)
Alkaline Phosphatase: 85 U/L (ref 39–117)
Bilirubin, Direct: 0 mg/dL (ref 0.0–0.3)
Total Bilirubin: 0.5 mg/dL (ref 0.3–1.2)
Total Protein: 7.7 g/dL (ref 6.0–8.3)

## 2012-05-02 LAB — LIPID PANEL
Total CHOL/HDL Ratio: 7
Triglycerides: 659 mg/dL — ABNORMAL HIGH (ref 0.0–149.0)

## 2012-05-02 LAB — LDL CHOLESTEROL, DIRECT: Direct LDL: 91 mg/dL

## 2012-05-02 NOTE — Progress Notes (Signed)
  Subjective:    Patient ID: Corey [name], male    DOB: 05/04/1966, 46 y.o.   MRN: [id number]  HPI  Patient seen for followup regarding hypertension and hyperlipidemia. Medications reviewed. Kidney stone back in April. This eventually passed. Current medications benazepril and atorvastatin. He takes Ambien occasionally as needed for sleep difficulties. He started exercising regularly several months ago. No recent dizziness. No chest pains. Appetite and weight are stable  Review of Systems  Constitutional: Negative for fatigue.  Eyes: Negative for visual disturbance.  Respiratory: Negative for cough, chest tightness and shortness of breath.   Cardiovascular: Negative for chest pain, palpitations and leg swelling.  Neurological: Negative for dizziness, syncope, weakness, light-headedness and headaches.       Objective:   Physical Exam  Constitutional: He appears well-developed and well-nourished.  Neck: Neck supple. No thyromegaly present.  Cardiovascular: Normal rate and regular rhythm.   No murmur heard. Pulmonary/Chest: Effort normal and breath sounds normal. No respiratory distress. He has no wheezes. He has no rales.          Assessment & Plan:  #1 hypertension stable. Continue current medication #2 hyperlipidemia. Check lipid and hepatic panel

## 2012-05-04 NOTE — Progress Notes (Signed)
Quick Note:  Pt informed on personally identified VM ______ 

## 2012-06-18 ENCOUNTER — Other Ambulatory Visit: Payer: Self-pay | Admitting: Family Medicine

## 2012-09-24 ENCOUNTER — Other Ambulatory Visit: Payer: Self-pay | Admitting: Family Medicine

## 2012-10-24 ENCOUNTER — Other Ambulatory Visit: Payer: Self-pay | Admitting: Family Medicine

## 2012-11-14 ENCOUNTER — Ambulatory Visit (INDEPENDENT_AMBULATORY_CARE_PROVIDER_SITE_OTHER): Payer: 59 | Admitting: Family Medicine

## 2012-11-14 ENCOUNTER — Encounter: Payer: Self-pay | Admitting: Family Medicine

## 2012-11-14 ENCOUNTER — Emergency Department (HOSPITAL_COMMUNITY)
Admission: EM | Admit: 2012-11-14 | Discharge: 2012-11-14 | Disposition: A | Discharge status: Home / Self Care | Payer: 59 | Attending: Emergency Medicine | Admitting: Emergency Medicine

## 2012-11-14 ENCOUNTER — Encounter (HOSPITAL_COMMUNITY): Payer: Self-pay | Admitting: *Deleted

## 2012-11-14 VITALS — BP 126/82 | HR 78 | Temp 98.5°F | Ht 69.0 in | Wt 213.0 lb

## 2012-11-14 DIAGNOSIS — Z8639 Personal history of other endocrine, nutritional and metabolic disease: Secondary | ICD-10-CM | POA: Insufficient documentation

## 2012-11-14 DIAGNOSIS — F329 Major depressive disorder, single episode, unspecified: Secondary | ICD-10-CM | POA: Insufficient documentation

## 2012-11-14 DIAGNOSIS — K5900 Constipation, unspecified: Secondary | ICD-10-CM

## 2012-11-14 DIAGNOSIS — E785 Hyperlipidemia, unspecified: Secondary | ICD-10-CM | POA: Insufficient documentation

## 2012-11-14 DIAGNOSIS — Z862 Personal history of diseases of the blood and blood-forming organs and certain disorders involving the immune mechanism: Secondary | ICD-10-CM | POA: Insufficient documentation

## 2012-11-14 DIAGNOSIS — F3289 Other specified depressive episodes: Secondary | ICD-10-CM | POA: Insufficient documentation

## 2012-11-14 DIAGNOSIS — Z87442 Personal history of urinary calculi: Secondary | ICD-10-CM | POA: Insufficient documentation

## 2012-11-14 DIAGNOSIS — Z79899 Other long term (current) drug therapy: Secondary | ICD-10-CM | POA: Insufficient documentation

## 2012-11-14 DIAGNOSIS — Z8719 Personal history of other diseases of the digestive system: Secondary | ICD-10-CM | POA: Insufficient documentation

## 2012-11-14 DIAGNOSIS — N23 Unspecified renal colic: Secondary | ICD-10-CM | POA: Insufficient documentation

## 2012-11-14 DIAGNOSIS — I10 Essential (primary) hypertension: Secondary | ICD-10-CM | POA: Insufficient documentation

## 2012-11-14 MED ORDER — BENAZEPRIL HCL 20 MG PO TABS: 20.0000 mg | ORAL_TABLET | Freq: Every day | ORAL | Status: DC

## 2012-11-14 NOTE — Progress Notes (Signed)
  Subjective:    Patient ID: Corey [name], male    DOB: March 17, 1966, 47 y.o.   MRN: [id number]  HPI Acute visit for constipation Patient relates no bowel movement in about 4 days. He has history of recurrent kidney stones. Recently took some oxycodone which he realizes may have worsened things. Bowel movements are generally normal. No bloody stools. He has some diffuse abdominal discomfort. No nausea or vomiting. Slight decrease in appetite. Drinking plenty of fluids. Generally eats lots of fiber. Took one dose of milk of magnesia and dose of Dulcolax without improvement.  Past Medical History  Diagnosis Date  . DEPRESSION 09/08/2009  . GERD 09/08/2009  . HYPERLIPIDEMIA 09/08/2009  . HYPERTENSION 09/08/2009  . NEPHROLITHIASIS, HX OF 09/08/2009  . PREDIABETES 09/08/2009  . Kidney stones    No past surgical history on file.  reports that he has never smoked. He does not have any smokeless tobacco history on file. He reports that  drinks alcohol. He reports that he does not use illicit drugs. family history includes Diabetes in his father.  There is no history of Hyperlipidemia and Heart disease. No Known Allergies    Review of Systems  Constitutional: Negative for fever, chills and unexpected weight change.  Respiratory: Negative for cough and shortness of breath.   Cardiovascular: Negative for chest pain.  Gastrointestinal: Positive for constipation. Negative for nausea, vomiting and blood in stool.       Objective:   Physical Exam  Constitutional: He appears well-developed and well-nourished.  Cardiovascular: Normal rate and regular rhythm.   Pulmonary/Chest: Effort normal and breath sounds normal. No respiratory distress. He has no wheezes. He has no rales.  Abdominal: Soft. Bowel sounds are normal. He exhibits no distension and no mass. There is no tenderness. There is no rebound and no guarding.          Assessment & Plan:  Constipation exacerbated by opioids. Minimize  oxycodone to use as much as possible. Plenty of fluids. MiraLax as needed short-term. Touch base in a few days if not improving with the MiraLax

## 2012-11-14 NOTE — Patient Instructions (Addendum)
Constipation, Adult Constipation is when a person has fewer than 3 bowel movements a week; has difficulty having a bowel movement; or has stools that are dry, hard, or larger than normal. As people grow older, constipation is more common. If you try to fix constipation with medicines that make you have a bowel movement (laxatives), the problem may get worse. Long-term laxative use may cause the muscles of the colon to become weak. A low-fiber diet, not taking in enough fluids, and taking certain medicines may make constipation worse. CAUSES   Certain medicines, such as antidepressants, pain medicine, iron supplements, antacids, and water pills.   Certain diseases, such as diabetes, irritable bowel syndrome (IBS), thyroid disease, or depression.   Not drinking enough water.   Not eating enough fiber-rich foods.   Stress or travel.  Lack of physical activity or exercise.  Not going to the restroom when there is the urge to have a bowel movement.  Ignoring the urge to have a bowel movement.  Using laxatives too much. SYMPTOMS   Having fewer than 3 bowel movements a week.   Straining to have a bowel movement.   Having hard, dry, or larger than normal stools.   Feeling full or bloated.   Pain in the lower abdomen.  Not feeling relief after having a bowel movement. DIAGNOSIS  Your caregiver will take a medical history and perform a physical exam. Further testing may be done for severe constipation. Some tests may include:   A barium enema X-ray to examine your rectum, colon, and sometimes, your small intestine.  A sigmoidoscopy to examine your lower colon.  A colonoscopy to examine your entire colon. TREATMENT  Treatment will depend on the severity of your constipation and what is causing it. Some dietary treatments include drinking more fluids and eating more fiber-rich foods. Lifestyle treatments may include regular exercise. If these diet and lifestyle recommendations  do not help, your caregiver may recommend taking over-the-counter laxative medicines to help you have bowel movements. Prescription medicines may be prescribed if over-the-counter medicines do not work.  HOME CARE INSTRUCTIONS   Increase dietary fiber in your diet, such as fruits, vegetables, whole grains, and beans. Limit high-fat and processed sugars in your diet, such as [place] fries, hamburgers, cookies, candies, and soda.   A fiber supplement may be added to your diet if you cannot get enough fiber from foods.   Drink enough fluids to keep your urine clear or pale yellow.   Exercise regularly or as directed by your caregiver.   Go to the restroom when you have the urge to go. Do not hold it.  Only take medicines as directed by your caregiver. Do not take other medicines for constipation without talking to your caregiver first. SEEK IMMEDIATE MEDICAL CARE IF:   You have bright red blood in your stool.   Your constipation lasts for more than 4 days or gets worse.   You have abdominal or rectal pain.   You have thin, pencil-like stools.  You have unexplained weight loss. MAKE SURE YOU:   Understand these instructions.  Will watch your condition.  Will get help right away if you are not doing well or get worse. Document Released: 01/29/2004 Document Revised: 07/25/2011 Document Reviewed: 04/05/2011 [facility] Patient Information 2014 [place], [place].  Try over the counter Miralax once daily as needed for constipation.

## 2012-11-14 NOTE — ED Provider Notes (Signed)
History    CSN: [id number] Arrival date & time 11/14/12  2203  First MD Initiated Contact with Patient 11/14/12 2206     Chief Complaint  Patient presents with  . Abdominal Pain   (Consider location/radiation/quality/duration/timing/severity/associated sxs/prior Treatment) HPI Comments: 47 y/o male with a past medical history of kidney stones, hypertension, hyperlipidemia, GERD and depression presents to the emergency department complaining of gradual onset left sided flank pain radiating around towards his left groin x 4 days worsening throughout the day today. Pain described as sharp, 9/10, some relief with vicodin he has at home. Went to urologist at [facility] on Tuesday, had KUB, was given pain medication and advised to f/u on 7/9 for a passing stone. Has been using urine strainer and taking flomax. Today went to PCP due to being constipated over the past few days, was advised to increase fiber and take miralax, decrease use of narcotic pain medication. Tonight the pain got sharper and decided to come to the ED. Took a vicodin prior to arrival and pain at this time is not as bad. Denies increased urinary frequency, urgency or dysuria. No nausea, vomiting, fever or chills.  Patient is a 47 y.o. male presenting with abdominal pain. The history is provided by the patient.  Abdominal Pain Associated symptoms include abdominal pain. Pertinent negatives include no chest pain, chills, fever, nausea or vomiting.   Past Medical History  Diagnosis Date  . DEPRESSION 09/08/2009  . GERD 09/08/2009  . HYPERLIPIDEMIA 09/08/2009  . HYPERTENSION 09/08/2009  . NEPHROLITHIASIS, HX OF 09/08/2009  . PREDIABETES 09/08/2009  . Kidney stones    History reviewed. No pertinent past surgical history. Family History  Problem Relation Age of Onset  . Hyperlipidemia Neg Hx     family hx  . Heart disease Neg Hx     family  . Diabetes Father    History  Substance Use Topics  . Smoking status: Never  Smoker   . Smokeless tobacco: Not on file  . Alcohol Use: Yes    Review of Systems  Constitutional: Negative for fever and chills.  Respiratory: Negative for shortness of breath.   Cardiovascular: Negative for chest pain.  Gastrointestinal: Positive for abdominal pain and constipation. Negative for nausea and vomiting.  Genitourinary: Positive for flank pain. Negative for dysuria, urgency, frequency, hematuria, scrotal swelling, difficulty urinating and testicular pain.  Musculoskeletal: Negative for back pain.  All other systems reviewed and are negative.    Allergies  Review of patient's allergies indicates no known allergies.  Home Medications   Current Outpatient Rx  Name  Route  Sig  Dispense  Refill  . atorvastatin (LIPITOR) 10 MG tablet      TAKE 1 TABLET BY MOUTH EVERY DAY   90 tablet   3   . benazepril (LOTENSIN) 20 MG tablet   Oral   Take 1 tablet (20 mg total) by mouth daily.   90 tablet   3   . escitalopram (LEXAPRO) 20 MG tablet      TAKE 1/2 TABLET BY MOUTH EVERY DAY   45 tablet   1   . Multiple Vitamin (MULTIVITAMIN WITH MINERALS) TABS   Oral   Take 1 tablet by mouth daily.          BP 144/94  Pulse 96  Temp(Src) 99.1 F (37.3 C)  Resp 18  SpO2 97% Physical Exam  Nursing note and vitals reviewed. Constitutional: He is oriented to person, place, and time. He appears well-developed and  well-nourished. No distress.  HENT:  Head: Normocephalic and atraumatic.  Mouth/Throat: Oropharynx is clear and moist.  Eyes: Conjunctivae are normal. No scleral icterus.  Neck: Normal range of motion. Neck supple.  Cardiovascular: Normal rate, regular rhythm and normal heart sounds.   Pulmonary/Chest: Effort normal and breath sounds normal.  Abdominal: Soft. Normal appearance and bowel sounds are normal. There is tenderness. There is no rigidity, no rebound, no guarding and no CVA tenderness.    Musculoskeletal: Normal range of motion. He exhibits no  edema.  Neurological: He is alert and oriented to person, place, and time.  Skin: Skin is warm and dry. He is not diaphoretic.  Psychiatric: He has a normal mood and affect. His behavior is normal.    ED Course  Procedures (including critical care time) Labs Reviewed - No data to display No results found. 1. Ureteral colic   2. Constipation     MDM  Ureteral colic- known kidney stones. He is in NAD, resting comfortably on exam table. No urinary symptoms present. Close f/u with urology. Afebrile, normal vital signs. Has pain medication at home. Advised stool softeners along with regimen discussed with PCP earlier today for constipation. Has flomax at home. Return precautions discussed. Patient states understanding of plan and is agreeable.   Trevor Mace, PA-C 11/14/12 2310

## 2012-11-14 NOTE — ED Notes (Signed)
Pt history of kidney stones; started Sunday with lower left groin pain; went to [facility] on Tuesday and got pain meds; tonight pain unbearable with po meds

## 2012-11-15 NOTE — ED Provider Notes (Signed)
Medical screening examination/treatment/procedure(s) were performed by non-physician practitioner and as supervising physician I was immediately available for consultation/collaboration.  Geanie Pacifico M Kynzi Levay, MD 11/15/12 1223 

## 2012-12-03 ENCOUNTER — Other Ambulatory Visit: Payer: Self-pay | Admitting: Family Medicine

## 2013-01-14 ENCOUNTER — Other Ambulatory Visit: Payer: Self-pay | Admitting: Family Medicine

## 2013-01-16 ENCOUNTER — Ambulatory Visit (INDEPENDENT_AMBULATORY_CARE_PROVIDER_SITE_OTHER): Payer: 59 | Admitting: Family Medicine

## 2013-01-16 ENCOUNTER — Encounter: Payer: Self-pay | Admitting: Family Medicine

## 2013-01-16 VITALS — BP 118/74 | HR 86 | Temp 98.8°F | Wt 209.0 lb

## 2013-01-16 DIAGNOSIS — J988 Other specified respiratory disorders: Secondary | ICD-10-CM

## 2013-01-16 MED ORDER — PREDNISONE 20 MG PO TABS: ORAL_TABLET | ORAL | Status: DC

## 2013-01-16 NOTE — Patient Instructions (Addendum)
Proair 2 puffs every 4 hours as needed for cough and wheeze Finish out Cefuroxime. Start Prednisone.  Let me know if you have any fever after today.

## 2013-01-16 NOTE — Progress Notes (Signed)
  Subjective:    Patient ID: Corey [name], male    DOB: 1965/10/28, 47 y.o.   MRN: [id number]  HPI Acute visit. Patient developed increased cough last Friday along with low-grade fever. He's had some mild sore throat and minimal nasal congestion. His symptoms progressed and this past Monday he went to local urgent care. He had chest x-ray which apparently showed question of early pneumonia but not definitive. He was placed on Ceftin 500 mg twice daily. Fever is improving but still 100 this morning. Denies any nausea or vomiting.   He was prescribed Tussionex for cough but does not like the way this made him feel. He also states has not helped his cough much. Has had some wheezing. No history of asthma. Nonsmoker. Only minimal dyspnea with activity but not at rest. No pleuritic pain  Past Medical History  Diagnosis Date  . DEPRESSION 09/08/2009  . GERD 09/08/2009  . HYPERLIPIDEMIA 09/08/2009  . HYPERTENSION 09/08/2009  . NEPHROLITHIASIS, HX OF 09/08/2009  . PREDIABETES 09/08/2009  . Kidney stones    No past surgical history on file.  reports that he has never smoked. He does not have any smokeless tobacco history on file. He reports that  drinks alcohol. He reports that he does not use illicit drugs. family history includes Diabetes in his father. There is no history of Hyperlipidemia or Heart disease. No Known Allergies    Review of Systems  Constitutional: Positive for fever, chills and fatigue.  HENT: Positive for congestion and sore throat.   Respiratory: Positive for cough and wheezing.   Cardiovascular: Negative for chest pain.  Gastrointestinal: Negative for nausea and vomiting.  Neurological: Negative for headaches.       Objective:   Physical Exam  Constitutional: He appears well-developed and well-nourished.  HENT:  Right Ear: External ear normal.  Left Ear: External ear normal.  Mouth/Throat: Oropharynx is clear and moist.  Neck: Neck supple.  Cardiovascular: Normal  rate and regular rhythm.   Pulmonary/Chest: Effort normal.  Patient has some diffuse wheezes. He has increased rhonchi left base greater than right. Question of some faint rales left base          Assessment & Plan:  Acute respiratory illness with fever. Question left lower lobe pneumonia clinically. Patient on Ceftin and currently afebrile. No high-risk features such as vomiting, dehydration, age, chronic lung disease, etc. He has evidence for reactive airway component. Proair inhaler 2 puffs every 4 hours with sample given. Prednisone 40 mg daily for 5 days. Touch base if he has any persistent fever by tomorrow or any worsening symptoms.

## 2013-01-17 ENCOUNTER — Telehealth: Payer: Self-pay | Admitting: Pharmacist

## 2013-01-17 NOTE — Telephone Encounter (Signed)
Patient Information:  Caller Name: Corey [name]  Phone: [phone]  Patient: Corey, [name]  Gender: Male  DOB: 19-May-1965  Age: 47 Years  PCP: Corey [name])  Office Follow Up:  Does the office need to follow up with this patient?: No  Instructions For The Office: N/A   Symptoms  Reason For Call & Symptoms: Pt saw Dr. Caryl Never 01/17/13 for fever  x 7days.  Dr. Caryl Never instructed if pt still had a fever today to call back.  Temp this AM orally was 100.2 then 99.2. Pt was seen 01/16/13 in office for URI.  Reviewed Health History In EMR: Yes  Reviewed Medications In EMR: Yes  Reviewed Allergies In EMR: Yes  Reviewed Surgeries / Procedures: Yes  Date of Onset of Symptoms: 01/11/2013  Treatments Tried: Prednisone, Ceftin and PRoair inhaler.  Treatments Tried Worked: No  Guideline(s) Used:  No Protocol Available - Sick Adult  Disposition Per Guideline:   Home Care  Reason For Disposition Reached:   Patient's symptoms are safe to treat at home per nursing judgment  Advice Given:  Call Back If:  New symptoms develop  You become worse.  Patient Will Follow Care Advice:  YES

## 2013-08-20 ENCOUNTER — Other Ambulatory Visit: Payer: Self-pay | Admitting: Family Medicine

## 2013-11-27 ENCOUNTER — Other Ambulatory Visit: Payer: Self-pay | Admitting: Family Medicine

## 2013-12-11 ENCOUNTER — Other Ambulatory Visit: Payer: Self-pay | Admitting: Family Medicine

## 2014-03-07 ENCOUNTER — Encounter: Payer: Self-pay | Admitting: Family Medicine

## 2014-03-07 ENCOUNTER — Ambulatory Visit (INDEPENDENT_AMBULATORY_CARE_PROVIDER_SITE_OTHER): Payer: BC Managed Care – PPO | Admitting: Family Medicine

## 2014-03-07 VITALS — BP 124/80 | HR 76 | Temp 98.0°F | Wt 212.0 lb

## 2014-03-07 DIAGNOSIS — I10 Essential (primary) hypertension: Secondary | ICD-10-CM

## 2014-03-07 DIAGNOSIS — R7303 Prediabetes: Secondary | ICD-10-CM

## 2014-03-07 DIAGNOSIS — E785 Hyperlipidemia, unspecified: Secondary | ICD-10-CM

## 2014-03-07 DIAGNOSIS — Z23 Encounter for immunization: Secondary | ICD-10-CM

## 2014-03-07 DIAGNOSIS — F330 Major depressive disorder, recurrent, mild: Secondary | ICD-10-CM

## 2014-03-07 DIAGNOSIS — R7309 Other abnormal glucose: Secondary | ICD-10-CM

## 2014-03-07 LAB — LIPID PANEL
CHOLESTEROL: 191 mg/dL (ref 0–200)
HDL: 27.4 mg/dL — AB (ref >39.00)
NonHDL: 163.6
Total CHOL/HDL Ratio: 7
Triglycerides: 560 mg/dL — ABNORMAL HIGH (ref 0.0–149.0)
VLDL: 112 mg/dL — AB (ref 0.0–40.0)

## 2014-03-07 LAB — HEPATIC FUNCTION PANEL
ALT: 22 U/L (ref 0–53)
AST: 16 U/L (ref 0–37)
Albumin: 3.9 g/dL (ref 3.5–5.2)
Alkaline Phosphatase: 79 U/L (ref 39–117)
BILIRUBIN DIRECT: 0 mg/dL (ref 0.0–0.3)
TOTAL PROTEIN: 7.5 g/dL (ref 6.0–8.3)
Total Bilirubin: 0.3 mg/dL (ref 0.2–1.2)

## 2014-03-07 LAB — BASIC METABOLIC PANEL
BUN: 26 mg/dL — ABNORMAL HIGH (ref 6–23)
CO2: 22 mEq/L (ref 19–32)
Calcium: 9.5 mg/dL (ref 8.4–10.5)
Chloride: 106 mEq/L (ref 96–112)
Creatinine, Ser: 1.1 mg/dL (ref 0.4–1.5)
GFR: 73.76 mL/min (ref >60.00)
Glucose, Bld: 94 mg/dL (ref 70–99)
POTASSIUM: 4.2 meq/L (ref 3.5–5.1)
SODIUM: 138 meq/L (ref 135–145)

## 2014-03-07 LAB — HEMOGLOBIN A1C: HEMOGLOBIN A1C: 6 % (ref 4.6–6.5)

## 2014-03-07 MED ORDER — BENAZEPRIL HCL 20 MG PO TABS: 20.0000 mg | ORAL_TABLET | Freq: Every day | ORAL | Status: DC

## 2014-03-07 MED ORDER — ESCITALOPRAM OXALATE 20 MG PO TABS: ORAL_TABLET | ORAL | Status: DC

## 2014-03-07 MED ORDER — ATORVASTATIN CALCIUM 10 MG PO TABS: 10.0000 mg | ORAL_TABLET | Freq: Every day | ORAL | Status: DC

## 2014-03-07 NOTE — Progress Notes (Signed)
Pre visit review using our clinic review tool, if applicable. No additional management support is needed unless otherwise documented below in the visit note. 

## 2014-03-07 NOTE — Progress Notes (Signed)
   Subjective:    Patient ID: Corey [name], male    DOB: May 12, 1966, 48 y.o.   MRN: [id number]  HPI Patient seen for routine medical followup. Has chronic problems include history prediabetes, recurrent depression, hyperlipidemia, hypertension. He has not had labs in over a year. His blood pressure been stable. He started exercising over the past year those ways somewhat stable he feels he has lost some body fat. His history of kidney stones and had one couple weeks ago. Currently asymptomatic. Requesting flu vaccine. Medications are reviewed and compliant with all  Past Medical History  Diagnosis Date  . DEPRESSION 09/08/2009  . GERD 09/08/2009  . HYPERLIPIDEMIA 09/08/2009  . HYPERTENSION 09/08/2009  . NEPHROLITHIASIS, HX OF 09/08/2009  . PREDIABETES 09/08/2009  . Kidney stones    No past surgical history on file.  reports that he has never smoked. He does not have any smokeless tobacco history on file. He reports that he drinks alcohol. He reports that he does not use illicit drugs. family history includes Diabetes in his father. There is no history of Hyperlipidemia or Heart disease. No Known Allergies    Review of Systems  Constitutional: Negative for fatigue.  Eyes: Negative for visual disturbance.  Respiratory: Negative for cough, chest tightness and shortness of breath.   Cardiovascular: Negative for chest pain, palpitations and leg swelling.  Neurological: Negative for dizziness, syncope, weakness, light-headedness and headaches.       Objective:   Physical Exam  Constitutional: He appears well-developed and well-nourished.  Neck: Neck supple. No thyromegaly present.  Cardiovascular: Normal rate and regular rhythm.  Exam reveals no gallop.   No murmur heard. Pulmonary/Chest: Effort normal and breath sounds normal. No respiratory distress. He has no wheezes. He has no rales.  Musculoskeletal: He exhibits no edema.          Assessment & Plan:  #1 hypertension. Stable  and at goal. Refill medication for one year #2 history of dyslipidemia. Hx of elevated cholesterol and triglycerides. Recheck lipids today. Refill Lipitor. Consider fibric acid derivative if triglyceride still substantially elevated #3 history of recurrent depression stable. Refill Lexapro for one year #4 history prediabetes. Hopefully improved with recent exercise and weight control efforts. Check A1c

## 2014-03-10 ENCOUNTER — Telehealth: Payer: Self-pay | Admitting: Family Medicine

## 2014-03-10 LAB — LDL CHOLESTEROL, DIRECT: LDL DIRECT: 84 mg/dL

## 2014-03-10 NOTE — Telephone Encounter (Signed)
emmi mailed  °

## 2014-09-29 ENCOUNTER — Ambulatory Visit (INDEPENDENT_AMBULATORY_CARE_PROVIDER_SITE_OTHER): Payer: BLUE CROSS/BLUE SHIELD | Admitting: Licensed Clinical Social Worker

## 2014-09-29 DIAGNOSIS — F4323 Adjustment disorder with mixed anxiety and depressed mood: Secondary | ICD-10-CM

## 2014-10-15 ENCOUNTER — Ambulatory Visit (INDEPENDENT_AMBULATORY_CARE_PROVIDER_SITE_OTHER): Payer: BLUE CROSS/BLUE SHIELD | Admitting: Licensed Clinical Social Worker

## 2014-10-15 DIAGNOSIS — F4323 Adjustment disorder with mixed anxiety and depressed mood: Secondary | ICD-10-CM

## 2014-11-03 ENCOUNTER — Ambulatory Visit (INDEPENDENT_AMBULATORY_CARE_PROVIDER_SITE_OTHER): Payer: BLUE CROSS/BLUE SHIELD | Admitting: Licensed Clinical Social Worker

## 2014-11-03 DIAGNOSIS — F4323 Adjustment disorder with mixed anxiety and depressed mood: Secondary | ICD-10-CM

## 2015-01-07 NOTE — H&P (Signed)
History of Present Illness     Follow-up nephrolithiasis. Patient passed a stone a few years ago but developed gross hematuria and left lower quadrant pain earlier in the month. He underwent a CT scan which revealed a 46mm left renal pelvic stone. He was to see me today to discuss treatment options but he was traveling in [place] when he developed left flank pain. He underwent an urgent left ureteral stent and they left a string. He's done well but has had some abdominal pain and weird dreams when he took Percocet. He asked for some other pain medicine. He's had no fevers or chills. No dysuria. He's had some gross hematuria although has been running for a 5K.      KUB today reveals 20 mm faint left renal pelvic stone. On CT the skin to stone distance was 8 cm and Hounsfield units 480.   Past Medical History Problems  1. History of Anxiety (F41.9) 2. History of depression (Z86.59) 3. History of esophageal reflux (Z87.19) 4. History of hypercholesterolemia (Z86.39) 5. History of hypertension (Z86.79) 6. History of renal calculi (G38.756)  Surgical History Problems  1. History of Oral Surgery  Current Meds 1. Atorvastatin Calcium 10 MG Oral Tablet;  Therapy: (Recorded:08Apr2013) to Recorded 2. Benazepril HCl - 20 MG Oral Tablet;  Therapy: (Recorded:08Apr2013) to Recorded 3. Escitalopram Oxalate 20 MG Oral Tablet; TAKE 1/2 TABLET DAILY;  Therapy: 03Apr2013 to Recorded 4. Fish Oil CAPS;  Therapy: (Recorded:04Aug2016) to Recorded 5. Multi-Vitamin TABS;  Therapy: (Recorded:01Jul2014) to Recorded 6. Tamsulosin HCl - 0.4 MG Oral Capsule; TAKE 1 CAPSULE Daily;  Therapy: [id number] to (Last Rx:04Aug2016)  Requested for: 04Aug2016 Ordered  Allergies Medication  1. hydrocodone 2. Naproxen TABS  Family History Problems  1. Family history of Prostate Cancer : Paternal Grandfather  Social History Problems  1. Alcohol Use   2 per day 2. Caffeine Use   1-2 per day 3. Marital  History - Single 4. Never A Smoker 5. 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  Vitals Vital Signs [Data Includes: Last 1 Day]  Recorded: 24Aug2016 03:08PM  Blood Pressure: 133 / 85 Temperature: 98.5 F Heart Rate: 56  Results/Data Urine [Data Includes: Last 1 Day]   24Aug2016  COLOR YELLOW   APPEARANCE CLEAR   SPECIFIC GRAVITY 1.025   pH 5.5   GLUCOSE NEGATIVE   BILIRUBIN NEGATIVE   KETONE NEGATIVE   BLOOD 3+   PROTEIN TRACE   NITRITE NEGATIVE   LEUKOCYTE ESTERASE 1+   SQUAMOUS EPITHELIAL/HPF 0-5 HPF  WBC 0-5 WBC/HPF  RBC 20-40 RBC/HPF  BACTERIA NONE SEEN HPF  CRYSTALS NONE SEEN HPF  CASTS NONE SEEN LPF  Yeast NONE SEEN HPF   Procedure KUB-left stent in good position, faint 20 mm left renal pelvic stone. The bowel gas pattern appeared normal. The bones appear normal.     Assessment Assessed  1. Nephrolithiasis (N20.0)  Plan Health Maintenance  1. UA With REFLEX; [Do Not Release]; Status:Complete;   Done: [id number] 02:51PM Nephrolithiasis  2. Start: HYDROmorphone HCl - 4 MG Oral Tablet; TAKE 1 TABLET EVERY 4 TO 6 HOURS  AS NEEDED FOR PAIN 3. Start: Tamsulosin HCl - 0.4 MG Oral Capsule; TAKE 1 CAPSULE Daily 4. Follow-up Schedule Surgery Office  Follow-up  Status: Hold For - Appointment   Requested for: 24Aug2016  Discussion/Summary 2 cm left renal pelvic stone-with a long discussion about the nature risk and benefits of simply removing the stent and continuing surveillance, ureteroscopy or shockwave lithotripsy. I told him with the  stent in place I favored ureteroscopy however, there is a short skin to stone distance and the stone is not very dense indicating shockwave lithotripsy would have good opportunity for success. Patient would like to expedite treatment which would favor shockwave lithotripsy as we could set him up for this tomorrow. I discussed with him the stone is large and he may need a staged procedure with a follow-up ureteroscopy. We discussed  risk of bleeding and infection among others were shockwave lithotripsy as well as failure of stone to fragment and failure to pass fragments. He has not been on any aspirin or ibuprofen in the last few days. I gave him a prescription for Dilaudid and tamsulosin.     Signatures Electronically signed by : [name], M.D.; Jan 07 2015  3:58PM EST

## 2015-01-08 ENCOUNTER — Ambulatory Visit (HOSPITAL_COMMUNITY): Payer: BLUE CROSS/BLUE SHIELD

## 2015-01-08 ENCOUNTER — Encounter (HOSPITAL_COMMUNITY)
Admission: RE | Disposition: A | Discharge status: Home / Self Care | Payer: Self-pay | Source: Ambulatory Visit | Attending: Urology

## 2015-01-08 ENCOUNTER — Ambulatory Visit (HOSPITAL_COMMUNITY)
Admission: RE | Admit: 2015-01-08 | Discharge: 2015-01-08 | Disposition: A | Discharge status: Home / Self Care | Payer: BLUE CROSS/BLUE SHIELD | Source: Ambulatory Visit | Attending: Urology | Admitting: Urology

## 2015-01-08 ENCOUNTER — Encounter (HOSPITAL_COMMUNITY): Payer: Self-pay | Admitting: *Deleted

## 2015-01-08 ENCOUNTER — Other Ambulatory Visit: Payer: Self-pay | Admitting: Urology

## 2015-01-08 DIAGNOSIS — I10 Essential (primary) hypertension: Secondary | ICD-10-CM | POA: Insufficient documentation

## 2015-01-08 DIAGNOSIS — E780 Pure hypercholesterolemia: Secondary | ICD-10-CM | POA: Diagnosis not present

## 2015-01-08 DIAGNOSIS — Z79899 Other long term (current) drug therapy: Secondary | ICD-10-CM | POA: Insufficient documentation

## 2015-01-08 DIAGNOSIS — Z87442 Personal history of urinary calculi: Secondary | ICD-10-CM | POA: Diagnosis not present

## 2015-01-08 DIAGNOSIS — K219 Gastro-esophageal reflux disease without esophagitis: Secondary | ICD-10-CM | POA: Insufficient documentation

## 2015-01-08 DIAGNOSIS — N200 Calculus of kidney: Secondary | ICD-10-CM

## 2015-01-08 DIAGNOSIS — F329 Major depressive disorder, single episode, unspecified: Secondary | ICD-10-CM | POA: Insufficient documentation

## 2015-01-08 DIAGNOSIS — R109 Unspecified abdominal pain: Secondary | ICD-10-CM | POA: Diagnosis present

## 2015-01-08 SURGERY — LITHOTRIPSY, ESWL
Anesthesia: Choice | Laterality: Left

## 2015-01-08 MED ORDER — SODIUM CHLORIDE 0.9 % IV SOLN
INTRAVENOUS | Status: DC
  Administered 2015-01-08: 15:00:00 via INTRAVENOUS

## 2015-01-08 MED ORDER — CIPROFLOXACIN HCL 500 MG PO TABS
500.0000 mg | ORAL_TABLET | ORAL | Status: AC
  Administered 2015-01-08: 500 mg via ORAL
  Filled 2015-01-08: qty 1

## 2015-01-08 MED ORDER — DIAZEPAM 5 MG PO TABS
10.0000 mg | ORAL_TABLET | ORAL | Status: AC
  Administered 2015-01-08: 10 mg via ORAL
  Filled 2015-01-08: qty 2

## 2015-01-08 MED ORDER — TAMSULOSIN HCL 0.4 MG PO CAPS: 0.4000 mg | ORAL_CAPSULE | Freq: Every day | ORAL | Status: DC

## 2015-01-08 MED ORDER — DIPHENHYDRAMINE HCL 25 MG PO CAPS
25.0000 mg | ORAL_CAPSULE | ORAL | Status: AC
  Administered 2015-01-08: 25 mg via ORAL
  Filled 2015-01-08: qty 1

## 2015-01-08 NOTE — Op Note (Signed)
Left Renal stone (pt has stent with tether)  Left ESWL

## 2015-01-08 NOTE — Discharge Instructions (Signed)
Lithotripsy for Kidney Stones °Lithotripsy is a treatment that can sometimes help eliminate kidney stones and pain that they cause. A form of lithotripsy, also known as extracorporeal shock wave lithotripsy, is a nonsurgical procedure that helps your body rid itself of the kidney stone when it is too big to pass on its own. Extracorporeal shock wave lithotripsy is a method of crushing a kidney stone with shock waves. These shock waves pass through your body and are focused on your stone. They cause the kidney stones to crumble while still in the urinary tract. It is then easier for the smaller pieces of stone to pass in the urine. °Lithotripsy usually takes about an hour. It is done in a hospital, a lithotripsy center, or a mobile unit. It usually does not require an overnight stay. Your health care provider will instruct you on preparation for the procedure. Your health care provider will tell you what to expect afterward. °LET YOUR HEALTH CARE PROVIDER KNOW ABOUT: °· Any allergies you have. °· All medicines you are taking, including vitamins, herbs, eye drops, creams, and over-the-counter medicines. °· Previous problems you or members of your family have had with the use of anesthetics. °· Any blood disorders you have. °· Previous surgeries you have had. °· Medical conditions you have. °RISKS AND COMPLICATIONS °Generally, lithotripsy for kidney stones is a safe procedure. However, as with any procedure, complications can occur. Possible complications include: °· Infection. °· Bleeding of the kidney. °· Bruising of the kidney or skin. °· Obstruction of the ureter. °· Failure of the stone to fragment. °BEFORE THE PROCEDURE °· Do not eat or drink for 6-8 hours prior to the procedure. You may, however, take the medications with a sip of water that your physician instructs you to take °· Do not take aspirin or aspirin-containing products for 7 days prior to your procedure °· Do not take nonsteroidal anti-inflammatory  products for 7 days prior to your procedure °PROCEDURE °A stent (flexible tube with holes) may be placed in your ureter. The ureter is the tube that transports the urine from the kidneys to the bladder. Your health care provider may place a stent before the procedure. This will help keep urine flowing from the kidney if the fragments of the stone block the ureter. You may have an IV tube placed in one of your veins to give you fluids and medicines. These medicines may help you relax or make you sleep. During the procedure, you will lie comfortably on a fluid-filled cushion or in a warm-water bath. After an X-ray or ultrasound exam to locate your stone, shock waves are aimed at the stone. If you are awake, you may feel a tapping sensation as the shock waves pass through your body. If large stone particles remain after treatment, a second procedure may be necessary at a later date. °For comfort during the test: °· Relax as much as possible. °· Try to remain still as much as possible. °· Try to follow instructions to speed up the test. °· Let your health care provider know if you are uncomfortable, anxious, or in pain. °AFTER THE PROCEDURE  °After surgery, you will be taken to the recovery area. A nurse will watch and check your progress. Once you're awake, stable, and taking fluids well, you will be allowed to go home as long as there are no problems. You will also be allowed to pass your urine before discharge. You may be given antibiotics to help prevent infection. You may also be prescribed   pain medicine if needed. In a week or two, your health care provider may remove your stent, if you have one. You may first have an X-ray exam to check on how successful the fragmentation of your stone has been and how much of the stone has passed. Your health care provider will check to see whether or not stone particles remain. °SEEK IMMEDIATE MEDICAL CARE IF: °· You develop a fever or shaking chills. °· Your pain is not  relieved by medicine. °· You feel sick to your stomach (nauseated) and you vomit. °· You develop heavy bleeding. °· You have difficulty urinating. °· You start to pass your stent from your penis. °Document Released: 04/29/2000 Document Revised: 02/20/2013 Document Reviewed: 11/15/2012 °ExitCare® Patient Information ©2015 [employer]. This information is not intended to replace advice given to you by your health care provider. Make sure you discuss any questions you have with your health care provider. ° °

## 2015-01-08 NOTE — Interval H&P Note (Signed)
History and Physical Interval Note:  01/08/2015 3:10 PM  Corey [name]  has presented today for surgery, with the diagnosis of Left ureteral stone  The various methods of treatment have been discussed with the patient and family. After consideration of risks, benefits and other options for treatment, the patient has consented to  Procedure(s): EXTRACORPOREAL SHOCK WAVE LITHOTRIPSY (ESWL) (Left) as a surgical intervention .  The patient's history has been reviewed, patient examined, no change in status, stable for surgery.  I have reviewed the patient's chart and labs.  Questions were answered to the patient's satisfaction.    Shunna Mikaelian

## 2015-02-24 ENCOUNTER — Encounter: Payer: Self-pay | Admitting: Family Medicine

## 2015-02-24 ENCOUNTER — Ambulatory Visit (INDEPENDENT_AMBULATORY_CARE_PROVIDER_SITE_OTHER): Payer: 59 | Admitting: Family Medicine

## 2015-02-24 VITALS — BP 118/78 | HR 69 | Temp 98.0°F | Wt 212.8 lb

## 2015-02-24 DIAGNOSIS — Z23 Encounter for immunization: Secondary | ICD-10-CM | POA: Diagnosis not present

## 2015-02-24 DIAGNOSIS — E785 Hyperlipidemia, unspecified: Secondary | ICD-10-CM | POA: Diagnosis not present

## 2015-02-24 DIAGNOSIS — I10 Essential (primary) hypertension: Secondary | ICD-10-CM | POA: Diagnosis not present

## 2015-02-24 LAB — BASIC METABOLIC PANEL
BUN: 24 mg/dL — AB (ref 6–23)
CHLORIDE: 108 meq/L (ref 96–112)
CO2: 21 meq/L (ref 19–32)
CREATININE: 1.07 mg/dL (ref 0.40–1.50)
Calcium: 9.5 mg/dL (ref 8.4–10.5)
GFR: 78.23 mL/min (ref >60.00)
Glucose, Bld: 98 mg/dL (ref 70–99)
Potassium: 4.3 mEq/L (ref 3.5–5.1)
Sodium: 140 mEq/L (ref 135–145)

## 2015-02-24 LAB — LIPID PANEL
Cholesterol: 192 mg/dL (ref 0–200)
HDL: 26.9 mg/dL — AB (ref >39.00)
Total CHOL/HDL Ratio: 7
Triglycerides: 768 mg/dL — ABNORMAL HIGH (ref 0.0–149.0)

## 2015-02-24 LAB — HEPATIC FUNCTION PANEL
ALK PHOS: 81 U/L (ref 39–117)
ALT: 23 U/L (ref 0–53)
AST: 16 U/L (ref 0–37)
Albumin: 4.2 g/dL (ref 3.5–5.2)
BILIRUBIN DIRECT: 0.1 mg/dL (ref 0.0–0.3)
TOTAL PROTEIN: 7.2 g/dL (ref 6.0–8.3)
Total Bilirubin: 0.3 mg/dL (ref 0.2–1.2)

## 2015-02-24 LAB — LDL CHOLESTEROL, DIRECT: LDL DIRECT: 70 mg/dL

## 2015-02-24 MED ORDER — BENAZEPRIL HCL 20 MG PO TABS: 20.0000 mg | ORAL_TABLET | Freq: Every day | ORAL | Status: DC

## 2015-02-24 MED ORDER — ATORVASTATIN CALCIUM 10 MG PO TABS: 10.0000 mg | ORAL_TABLET | Freq: Every day | ORAL | Status: DC

## 2015-02-24 MED ORDER — ESCITALOPRAM OXALATE 10 MG PO TABS: ORAL_TABLET | ORAL | Status: DC

## 2015-02-24 NOTE — Progress Notes (Signed)
   Subjective:    Patient ID: Corey [name], male    DOB: February 01, 1966, 49 y.o.   MRN: [id number]  HPI Medical follow-up  Dyslipidemia. He has history of high triglycerides, high cholesterol, and low HDL. He has been taking omega-3 supplement though not over the past couple of weeks. He remains on Lipitor. No myalgias. Recently started exercising more with running. Strong family history of type 2 diabetes  Hypertension treated with benazepril. Blood pressure stable by home readings. No headaches. No dizziness.  Patient takes Lexapro. He was placed on this years ago for anxiety and also some depression issues. He currently takes 10 mg daily and would like to taper off. His mood is very stable.  Past Medical History  Diagnosis Date  . DEPRESSION 09/08/2009  . GERD 09/08/2009  . HYPERLIPIDEMIA 09/08/2009  . HYPERTENSION 09/08/2009  . NEPHROLITHIASIS, HX OF 09/08/2009  . PREDIABETES 09/08/2009  . Kidney stones    No past surgical history on file.  reports that he has never smoked. He does not have any smokeless tobacco history on file. He reports that he drinks alcohol. He reports that he does not use illicit drugs. family history includes Diabetes in his father. There is no history of Hyperlipidemia or Heart disease. No Known Allergies    Review of Systems  Constitutional: Negative for fatigue.  Eyes: Negative for visual disturbance.  Respiratory: Negative for cough, chest tightness and shortness of breath.   Cardiovascular: Negative for chest pain, palpitations and leg swelling.  Neurological: Negative for dizziness, syncope, weakness, light-headedness and headaches.       Objective:   Physical Exam  Constitutional: He appears well-developed and well-nourished.  Neck: Neck supple. No thyromegaly present.  Cardiovascular: Normal rate and regular rhythm.  Exam reveals no gallop.   No murmur heard. Pulmonary/Chest: Effort normal and breath sounds normal. No respiratory distress. He has  no wheezes. He has no rales.  Musculoskeletal: He exhibits no edema.  Lymphadenopathy:    He has no cervical adenopathy.  Psychiatric: He has a normal mood and affect. His behavior is normal.          Assessment & Plan:  #1 hypertension stable and at goal. Refill benazepril for one year #2 hyperlipidemia. He's had very high triglycerides in the past. Continue omega-3 supplement. Refill Lipitor. Check fasting lipid and hepatic panel #3 history of anxiety/ depression. Currently stable. Patient desiring to taper off Lexapro. Rewrote prescription for 10 mg and he will take one half daily for 2-3 weeks then every other day for 2 weeks then discontinue

## 2015-02-24 NOTE — Progress Notes (Signed)
Pre visit review using our clinic review tool, if applicable. No additional management support is needed unless otherwise documented below in the visit note. 

## 2015-02-25 ENCOUNTER — Telehealth: Payer: Self-pay

## 2015-02-25 NOTE — Telephone Encounter (Signed)
Left message for patient to call office regarding lab results and directions. 

## 2015-02-25 NOTE — Telephone Encounter (Signed)
-----   Message from Eulas Post, MD sent at 02/24/2015  4:36 PM EDT ----- Cholesterol is stable.  Triglycerides are up.  Make sure he gets back on Omega 3 supplement OTC 3 gm daily.

## 2015-12-14 ENCOUNTER — Ambulatory Visit (INDEPENDENT_AMBULATORY_CARE_PROVIDER_SITE_OTHER): Payer: 59 | Admitting: Licensed Clinical Social Worker

## 2015-12-14 DIAGNOSIS — F4323 Adjustment disorder with mixed anxiety and depressed mood: Secondary | ICD-10-CM | POA: Diagnosis not present

## 2015-12-22 ENCOUNTER — Ambulatory Visit (INDEPENDENT_AMBULATORY_CARE_PROVIDER_SITE_OTHER): Payer: 59 | Admitting: Licensed Clinical Social Worker

## 2015-12-22 DIAGNOSIS — F4323 Adjustment disorder with mixed anxiety and depressed mood: Secondary | ICD-10-CM

## 2016-01-05 ENCOUNTER — Ambulatory Visit (INDEPENDENT_AMBULATORY_CARE_PROVIDER_SITE_OTHER): Payer: 59 | Admitting: Licensed Clinical Social Worker

## 2016-01-05 DIAGNOSIS — F4323 Adjustment disorder with mixed anxiety and depressed mood: Secondary | ICD-10-CM | POA: Diagnosis not present

## 2016-02-24 ENCOUNTER — Encounter: Payer: Self-pay | Admitting: Family Medicine

## 2016-02-24 ENCOUNTER — Ambulatory Visit (INDEPENDENT_AMBULATORY_CARE_PROVIDER_SITE_OTHER): Payer: 59 | Admitting: Family Medicine

## 2016-02-24 VITALS — BP 110/80 | HR 69 | Temp 98.3°F | Ht 69.0 in | Wt 202.0 lb

## 2016-02-24 DIAGNOSIS — Z23 Encounter for immunization: Secondary | ICD-10-CM | POA: Diagnosis not present

## 2016-02-24 DIAGNOSIS — E785 Hyperlipidemia, unspecified: Secondary | ICD-10-CM

## 2016-02-24 DIAGNOSIS — I10 Essential (primary) hypertension: Secondary | ICD-10-CM

## 2016-02-24 LAB — BASIC METABOLIC PANEL
BUN: 18 mg/dL (ref 6–23)
CO2: 24 mEq/L (ref 19–32)
CREATININE: 1.11 mg/dL (ref 0.40–1.50)
Calcium: 9.6 mg/dL (ref 8.4–10.5)
Chloride: 107 mEq/L (ref 96–112)
GFR: 74.68 mL/min (ref >60.00)
Glucose, Bld: 108 mg/dL — ABNORMAL HIGH (ref 70–99)
Potassium: 4.4 mEq/L (ref 3.5–5.1)
Sodium: 140 mEq/L (ref 135–145)

## 2016-02-24 LAB — LIPID PANEL
CHOLESTEROL: 167 mg/dL (ref 0–200)
HDL: 29.2 mg/dL — ABNORMAL LOW (ref >39.00)
NonHDL: 137.87
TRIGLYCERIDES: 355 mg/dL — AB (ref 0.0–149.0)
Total CHOL/HDL Ratio: 6
VLDL: 71 mg/dL — ABNORMAL HIGH (ref 0.0–40.0)

## 2016-02-24 LAB — HEPATIC FUNCTION PANEL
ALBUMIN: 4.2 g/dL (ref 3.5–5.2)
ALK PHOS: 83 U/L (ref 39–117)
ALT: 20 U/L (ref 0–53)
AST: 15 U/L (ref 0–37)
BILIRUBIN DIRECT: 0.1 mg/dL (ref 0.0–0.3)
TOTAL PROTEIN: 6.9 g/dL (ref 6.0–8.3)
Total Bilirubin: 0.4 mg/dL (ref 0.2–1.2)

## 2016-02-24 LAB — LDL CHOLESTEROL, DIRECT: Direct LDL: 81 mg/dL

## 2016-02-24 MED ORDER — BENAZEPRIL HCL 20 MG PO TABS: 20.0000 mg | ORAL_TABLET | Freq: Every day | ORAL | 3 refills | Status: DC

## 2016-02-24 MED ORDER — ATORVASTATIN CALCIUM 10 MG PO TABS: 10.0000 mg | ORAL_TABLET | Freq: Every day | ORAL | 3 refills | Status: DC

## 2016-02-24 NOTE — Progress Notes (Signed)
Pre visit review using our clinic review tool, if applicable. No additional management support is needed unless otherwise documented below in the visit note. 

## 2016-02-24 NOTE — Progress Notes (Signed)
Subjective:     Patient ID: Corey [name], male   DOB: 07-03-1965, 50 y.o.   MRN: [id number]  HPI Here for medical follow-up. He has hypertension and dyslipidemia. Remains on Lipitor and benazepril. He's lost some weight due to his efforts during the past year. He tapered off Lexapro and thinks that helped (with weight loss). Depression is stable. Nonsmoker. No consistent use of alcohol. No consistent exercise. Compliant with medications. Needs flu vaccine.  Past Medical History:  Diagnosis Date  . DEPRESSION 09/08/2009  . GERD 09/08/2009  . HYPERLIPIDEMIA 09/08/2009  . HYPERTENSION 09/08/2009  . Kidney stones   . NEPHROLITHIASIS, HX OF 09/08/2009  . PREDIABETES 09/08/2009   History reviewed. No pertinent surgical history.  reports that he has never smoked. He has never used smokeless tobacco. He reports that he drinks alcohol. He reports that he does not use drugs. family history includes Diabetes in his father. No Known Allergies   Review of Systems  Constitutional: Negative for fatigue.  Eyes: Negative for visual disturbance.  Respiratory: Negative for cough, chest tightness and shortness of breath.   Cardiovascular: Negative for chest pain, palpitations and leg swelling.  Endocrine: Negative for polydipsia and polyuria.  Neurological: Negative for dizziness, syncope, weakness, light-headedness and headaches.       Objective:   Physical Exam  Constitutional: He is oriented to person, place, and time. He appears well-developed and well-nourished.  HENT:  Right Ear: External ear normal.  Left Ear: External ear normal.  Mouth/Throat: Oropharynx is clear and moist.  Eyes: Pupils are equal, round, and reactive to light.  Neck: Neck supple. No thyromegaly present.  Cardiovascular: Normal rate and regular rhythm.   Pulmonary/Chest: Effort normal and breath sounds normal. No respiratory distress. He has no wheezes. He has no rales.  Musculoskeletal: He exhibits no edema.  Neurological:  He is alert and oriented to person, place, and time.       Assessment:     #1 hypertension stable and at goal  #2 dyslipidemia    Plan:     -Continue weight control efforts -Refill medications for one year -Check labs with lipid panel, hepatic panel, basic metabolic panel -Flu vaccine given -Consider complete physical at some point this year and discuss colonoscopy and PSA screening after [place] [facility]

## 2016-02-24 NOTE — Patient Instructions (Signed)
Consider complete physical at some point in 2018.

## 2016-08-30 ENCOUNTER — Ambulatory Visit (INDEPENDENT_AMBULATORY_CARE_PROVIDER_SITE_OTHER): Payer: 59 | Admitting: Psychology

## 2016-08-30 DIAGNOSIS — F4322 Adjustment disorder with anxiety: Secondary | ICD-10-CM | POA: Diagnosis not present

## 2016-09-28 ENCOUNTER — Ambulatory Visit (INDEPENDENT_AMBULATORY_CARE_PROVIDER_SITE_OTHER): Payer: 59 | Admitting: Psychology

## 2016-09-28 DIAGNOSIS — F4322 Adjustment disorder with anxiety: Secondary | ICD-10-CM

## 2016-10-11 ENCOUNTER — Ambulatory Visit (INDEPENDENT_AMBULATORY_CARE_PROVIDER_SITE_OTHER): Payer: 59 | Admitting: Psychology

## 2016-10-11 DIAGNOSIS — F4322 Adjustment disorder with anxiety: Secondary | ICD-10-CM

## 2016-10-14 ENCOUNTER — Ambulatory Visit: Payer: 59 | Admitting: Psychology

## 2016-11-02 ENCOUNTER — Ambulatory Visit (INDEPENDENT_AMBULATORY_CARE_PROVIDER_SITE_OTHER): Payer: 59 | Admitting: Psychology

## 2016-11-02 DIAGNOSIS — F4322 Adjustment disorder with anxiety: Secondary | ICD-10-CM

## 2016-11-21 ENCOUNTER — Ambulatory Visit (INDEPENDENT_AMBULATORY_CARE_PROVIDER_SITE_OTHER): Payer: 59 | Admitting: Psychology

## 2016-11-21 DIAGNOSIS — F4322 Adjustment disorder with anxiety: Secondary | ICD-10-CM

## 2016-11-30 ENCOUNTER — Ambulatory Visit (INDEPENDENT_AMBULATORY_CARE_PROVIDER_SITE_OTHER): Payer: 59 | Admitting: Psychology

## 2016-11-30 DIAGNOSIS — F4322 Adjustment disorder with anxiety: Secondary | ICD-10-CM | POA: Diagnosis not present

## 2016-12-26 ENCOUNTER — Ambulatory Visit (INDEPENDENT_AMBULATORY_CARE_PROVIDER_SITE_OTHER): Payer: 59 | Admitting: Psychology

## 2016-12-26 DIAGNOSIS — F4322 Adjustment disorder with anxiety: Secondary | ICD-10-CM

## 2017-01-25 ENCOUNTER — Ambulatory Visit (INDEPENDENT_AMBULATORY_CARE_PROVIDER_SITE_OTHER): Payer: 59 | Admitting: Family Medicine

## 2017-01-25 ENCOUNTER — Encounter: Payer: Self-pay | Admitting: Family Medicine

## 2017-01-25 ENCOUNTER — Encounter: Payer: Self-pay | Admitting: Gastroenterology

## 2017-01-25 VITALS — BP 110/80 | HR 68 | Temp 99.0°F | Ht 69.0 in | Wt 199.6 lb

## 2017-01-25 DIAGNOSIS — Z23 Encounter for immunization: Secondary | ICD-10-CM | POA: Diagnosis not present

## 2017-01-25 DIAGNOSIS — Z0000 Encounter for general adult medical examination without abnormal findings: Secondary | ICD-10-CM | POA: Diagnosis not present

## 2017-01-25 LAB — CBC WITH DIFFERENTIAL/PLATELET
BASOS ABS: 0 10*3/uL (ref 0.0–0.1)
BASOS PCT: 0.5 % (ref 0.0–3.0)
Eosinophils Absolute: 0.1 10*3/uL (ref 0.0–0.7)
Eosinophils Relative: 1.1 % (ref 0.0–5.0)
HEMATOCRIT: 44.8 % (ref 39.0–52.0)
HEMOGLOBIN: 15.3 g/dL (ref 13.0–17.0)
Lymphocytes Relative: 36.4 % (ref 12.0–46.0)
Lymphs Abs: 2.5 10*3/uL (ref 0.7–4.0)
MCHC: 34.1 g/dL (ref 30.0–36.0)
MCV: 87.7 fl (ref 78.0–100.0)
MONOS PCT: 9.8 % (ref 3.0–12.0)
Monocytes Absolute: 0.7 10*3/uL (ref 0.1–1.0)
Neutro Abs: 3.5 10*3/uL (ref 1.4–7.7)
Neutrophils Relative %: 52.2 % (ref 43.0–77.0)
PLATELETS: 255 10*3/uL (ref 150.0–400.0)
RBC: 5.11 Mil/uL (ref 4.22–5.81)
RDW: 12.9 % (ref 11.5–15.5)
WBC: 6.7 10*3/uL (ref 4.0–10.5)

## 2017-01-25 LAB — HEPATIC FUNCTION PANEL
ALT: 22 U/L (ref 0–53)
AST: 15 U/L (ref 0–37)
Albumin: 4.5 g/dL (ref 3.5–5.2)
Alkaline Phosphatase: 71 U/L (ref 39–117)
BILIRUBIN DIRECT: 0.1 mg/dL (ref 0.0–0.3)
Total Bilirubin: 0.6 mg/dL (ref 0.2–1.2)
Total Protein: 6.7 g/dL (ref 6.0–8.3)

## 2017-01-25 LAB — LIPID PANEL
CHOLESTEROL: 163 mg/dL (ref 0–200)
HDL: 41 mg/dL (ref >39.00)
LDL Cholesterol: 87 mg/dL (ref 0–99)
NonHDL: 122.35
TRIGLYCERIDES: 176 mg/dL — AB (ref 0.0–149.0)
Total CHOL/HDL Ratio: 4
VLDL: 35.2 mg/dL (ref 0.0–40.0)

## 2017-01-25 LAB — BASIC METABOLIC PANEL
BUN: 18 mg/dL (ref 6–23)
CALCIUM: 9.7 mg/dL (ref 8.4–10.5)
CO2: 27 mEq/L (ref 19–32)
CREATININE: 1.24 mg/dL (ref 0.40–1.50)
Chloride: 105 mEq/L (ref 96–112)
GFR: 65.47 mL/min (ref >60.00)
Glucose, Bld: 94 mg/dL (ref 70–99)
Potassium: 4.7 mEq/L (ref 3.5–5.1)
Sodium: 141 mEq/L (ref 135–145)

## 2017-01-25 LAB — TSH: TSH: 1.74 u[IU]/mL (ref 0.35–4.50)

## 2017-01-25 LAB — PSA: PSA: 1.98 ng/mL (ref 0.10–4.00)

## 2017-01-25 NOTE — Patient Instructions (Signed)
We will set up referral to GI for colonoscopy.

## 2017-01-25 NOTE — Progress Notes (Signed)
Subjective:     Patient ID: Corey [name], male   DOB: November 28, 1965, 51 y.o.   MRN: [id number]  HPI   Pt here for physical exam. He has lost about 10 pounds this past year due to his efforts. He has history of hyperlipidemia treated with atorvastatin and hypertension treated with benazepril. 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 Generally feels well.  He had a grandfather with prostate cancer. No family history of colon cancer. Patient is nonsmoker. No regular alcohol use. Turned 51 early this year. No history of colonoscopy. Tetanus up-to-date. Flu vaccine given today  Past Medical History:  Diagnosis Date  . DEPRESSION 09/08/2009  . GERD 09/08/2009  . HYPERLIPIDEMIA 09/08/2009  . HYPERTENSION 09/08/2009  . Kidney stones   . NEPHROLITHIASIS, HX OF 09/08/2009  . PREDIABETES 09/08/2009   No past surgical history on file.  reports that he has never smoked. He has never used smokeless tobacco. He reports that he drinks alcohol. He reports that he does not use drugs. family history includes Diabetes in his father. No Known Allergies   Review of Systems  Constitutional: Negative for activity change, appetite change, fatigue and fever.  HENT: Negative for congestion, ear pain and trouble swallowing.   Eyes: Negative for pain and visual disturbance.  Respiratory: Negative for cough, shortness of breath and wheezing.   Cardiovascular: Negative for chest pain and palpitations.  Gastrointestinal: Negative for abdominal distention, abdominal pain, blood in stool, constipation, diarrhea, nausea, rectal pain and vomiting.  Genitourinary: Negative for dysuria, hematuria and testicular pain.  Musculoskeletal: Negative for arthralgias and joint swelling.  Skin: Negative for rash.  Neurological: Negative for dizziness, syncope and headaches.  Hematological: Negative for adenopathy.   Psychiatric/Behavioral: Negative for confusion and dysphoric mood.       Objective:   Physical Exam  Constitutional: He is oriented to person, place, and time. He appears well-developed and well-nourished. No distress.  HENT:  Head: Normocephalic and atraumatic.  Right Ear: External ear normal.  Left Ear: External ear normal.  Mouth/Throat: Oropharynx is clear and moist.  Eyes: Pupils are equal, round, and reactive to light. Conjunctivae and EOM are normal.  Neck: Normal range of motion. Neck supple. No thyromegaly present.  Cardiovascular: Normal rate, regular rhythm and normal heart sounds.   No murmur heard. Pulmonary/Chest: No respiratory distress. He has no wheezes. He has no rales.  Abdominal: Soft. Bowel sounds are normal. He exhibits no distension and no mass. There is no tenderness. There is no rebound and no guarding.  Musculoskeletal: He exhibits no edema.  Lymphadenopathy:    He has no cervical adenopathy.  Neurological: He is alert and oriented to person, place, and time. He displays normal reflexes. No cranial nerve deficit.  Skin: No rash noted.  Psychiatric: He has a normal mood and affect.       Assessment:     Physical exam    Plan:     -set up screening colonoscopy -Obtain screening lab work. The natural history of prostate cancer and ongoing controversy regarding screening and potential treatment outcomes of prostate cancer has been discussed with the patient. The meaning of a false positive PSA and a false negative PSA has been discussed. He indicates understanding of the limitations of this screening test and wishes to proceed with screening PSA testing. -continue weight loss efforts.  Eulas Post MD [facility] at [facility]

## 2017-02-09 ENCOUNTER — Ambulatory Visit: Payer: 59 | Admitting: Psychology

## 2017-03-08 ENCOUNTER — Other Ambulatory Visit: Payer: Self-pay | Admitting: Family Medicine

## 2017-03-24 ENCOUNTER — Encounter: Payer: Self-pay | Admitting: Gastroenterology

## 2017-05-02 DIAGNOSIS — M9901 Segmental and somatic dysfunction of cervical region: Secondary | ICD-10-CM | POA: Diagnosis not present

## 2017-05-02 DIAGNOSIS — M5386 Other specified dorsopathies, lumbar region: Secondary | ICD-10-CM | POA: Diagnosis not present

## 2017-05-02 DIAGNOSIS — M9908 Segmental and somatic dysfunction of rib cage: Secondary | ICD-10-CM | POA: Diagnosis not present

## 2017-05-02 DIAGNOSIS — M9902 Segmental and somatic dysfunction of thoracic region: Secondary | ICD-10-CM | POA: Diagnosis not present

## 2017-05-31 DIAGNOSIS — M9901 Segmental and somatic dysfunction of cervical region: Secondary | ICD-10-CM | POA: Diagnosis not present

## 2017-05-31 DIAGNOSIS — M9908 Segmental and somatic dysfunction of rib cage: Secondary | ICD-10-CM | POA: Diagnosis not present

## 2017-05-31 DIAGNOSIS — M5386 Other specified dorsopathies, lumbar region: Secondary | ICD-10-CM | POA: Diagnosis not present

## 2017-05-31 DIAGNOSIS — M9902 Segmental and somatic dysfunction of thoracic region: Secondary | ICD-10-CM | POA: Diagnosis not present

## 2017-06-28 DIAGNOSIS — M9908 Segmental and somatic dysfunction of rib cage: Secondary | ICD-10-CM | POA: Diagnosis not present

## 2017-06-28 DIAGNOSIS — M9902 Segmental and somatic dysfunction of thoracic region: Secondary | ICD-10-CM | POA: Diagnosis not present

## 2017-06-28 DIAGNOSIS — M9901 Segmental and somatic dysfunction of cervical region: Secondary | ICD-10-CM | POA: Diagnosis not present

## 2017-06-28 DIAGNOSIS — M5386 Other specified dorsopathies, lumbar region: Secondary | ICD-10-CM | POA: Diagnosis not present

## 2017-07-10 ENCOUNTER — Ambulatory Visit (AMBULATORY_SURGERY_CENTER): Payer: Self-pay

## 2017-07-10 ENCOUNTER — Other Ambulatory Visit: Payer: Self-pay

## 2017-07-10 VITALS — Ht 69.0 in | Wt 202.6 lb

## 2017-07-10 DIAGNOSIS — Z1211 Encounter for screening for malignant neoplasm of colon: Secondary | ICD-10-CM

## 2017-07-10 MED ORDER — NA SULFATE-K SULFATE-MG SULF 17.5-3.13-1.6 GM/177ML PO SOLN: 1.0000 | Freq: Once | ORAL | 0 refills | Status: AC

## 2017-07-10 NOTE — Progress Notes (Signed)
No egg or soy allergy known to patient  No issues with past sedation with any surgeries  or procedures, no intubation problems  No diet pills per patient No home 02 use per patient  No blood thinners per patient  Pt denies issues with constipation  No A fib or A flutter  EMMI video sent to pt's e mail sent during previsit

## 2017-07-13 ENCOUNTER — Encounter: Payer: Self-pay | Admitting: Gastroenterology

## 2017-07-14 HISTORY — PX: COLONOSCOPY: SHX174

## 2017-07-17 ENCOUNTER — Telehealth: Payer: Self-pay | Admitting: Gastroenterology

## 2017-07-17 NOTE — Telephone Encounter (Signed)
Spoke to pt and notified him that I will call his pharmacy and give them the pay no more than 50.00 coupon. Pt verbalize that is okay. Called the pharmacy and given the coupon and pharmacy verbalize coupon went thru.

## 2017-07-24 ENCOUNTER — Other Ambulatory Visit: Payer: Self-pay

## 2017-07-24 ENCOUNTER — Encounter: Payer: Self-pay | Admitting: Gastroenterology

## 2017-07-24 ENCOUNTER — Ambulatory Visit (AMBULATORY_SURGERY_CENTER): Payer: BLUE CROSS/BLUE SHIELD | Admitting: Gastroenterology

## 2017-07-24 VITALS — BP 102/66 | HR 58 | Temp 98.7°F | Resp 14 | Ht 69.0 in | Wt 202.0 lb

## 2017-07-24 DIAGNOSIS — Z1211 Encounter for screening for malignant neoplasm of colon: Secondary | ICD-10-CM | POA: Diagnosis not present

## 2017-07-24 DIAGNOSIS — D120 Benign neoplasm of cecum: Secondary | ICD-10-CM | POA: Diagnosis not present

## 2017-07-24 DIAGNOSIS — D122 Benign neoplasm of ascending colon: Secondary | ICD-10-CM

## 2017-07-24 DIAGNOSIS — D123 Benign neoplasm of transverse colon: Secondary | ICD-10-CM

## 2017-07-24 MED ORDER — SODIUM CHLORIDE 0.9 % IV SOLN: 500.0000 mL | Freq: Once | INTRAVENOUS | Status: DC

## 2017-07-24 NOTE — Op Note (Signed)
Corey [name] Patient Name: Corey [name] Procedure Date: 07/24/2017 11:44 AM MRN: [id number] Endoscopist: Remo Lipps P. Manan Olmo MD, MD Age: 52 Referring MD:  Date of Birth: 1965/10/19 Gender: Male Account #: [id number] Procedure:                Colonoscopy Indications:              Screening for colorectal malignant neoplasm, This                            is the patient's first colonoscopy Medicines:                Monitored Anesthesia Care Procedure:                Pre-Anesthesia Assessment:                           - Prior to the procedure, a History and Physical                            was performed, and patient medications and                            allergies were reviewed. The patient's tolerance of                            previous anesthesia was also reviewed. The risks                            and benefits of the procedure and the sedation                            options and risks were discussed with the patient.                            All questions were answered, and informed consent                            was obtained. Prior Anticoagulants: The patient has                            taken no previous anticoagulant or antiplatelet                            agents. ASA Grade Assessment: II - A patient with                            mild systemic disease. After reviewing the risks                            and benefits, the patient was deemed in                            satisfactory condition to undergo the procedure.  After obtaining informed consent, the colonoscope                            was passed under direct vision. Throughout the                            procedure, the patient's blood pressure, pulse, and                            oxygen saturations were monitored continuously. The                            Colonoscope was introduced through the anus and                            advanced to the the  cecum, identified by                            appendiceal orifice and ileocecal valve. The                            colonoscopy was performed without difficulty. The                            patient tolerated the procedure well. The quality                            of the bowel preparation was adequate. The                            ileocecal valve, appendiceal orifice, and rectum                            were photographed. Scope In: 11:46:01 AM Scope Out: 12:09:00 PM Scope Withdrawal Time: 0 hours 19 minutes 2 seconds  Total Procedure Duration: 0 hours 22 minutes 59 seconds  Findings:                 The perianal and digital rectal examinations were                            normal.                           Two sessile polyps were found in the cecum. The                            polyps were 3 to 4 mm in size. These polyps were                            removed with a cold snare. Resection and retrieval                            were complete.  A 3 mm polyp was found in the ileocecal valve. The                            polyp was sessile. The polyp was removed with a                            cold biopsy forceps. Resection and retrieval were                            complete.                           A diminutive polyp was found in the ascending                            colon. The polyp was sessile. The polyp was removed                            with a cold biopsy forceps. Resection and retrieval                            were complete.                           Two sessile polyps were found in the transverse                            colon. The polyps were 3 mm in size. These polyps                            were removed with a cold snare. Resection and                            retrieval were complete.                           A few medium-mouthed diverticula were found in the                            sigmoid colon.                            The sigmoid colon was significantly redundant.                           Internal hemorrhoids were found during retroflexion.                           The exam was otherwise without abnormality. Complications:            No immediate complications. Estimated blood loss:                            Minimal. Estimated Blood Loss:     Estimated blood loss was minimal. Impression:               -  Two 3 to 4 mm polyps in the cecum, removed with a                            cold snare. Resected and retrieved.                           - One 3 mm polyp at the ileocecal valve, removed                            with a cold biopsy forceps. Resected and retrieved.                           - One diminutive polyp in the ascending colon,                            removed with a cold biopsy forceps. Resected and                            retrieved.                           - Two 3 mm polyps in the transverse colon, removed                            with a cold snare. Resected and retrieved.                           - Diverticulosis in the sigmoid colon.                           - Redundant colon.                           - Internal hemorrhoids.                           - The examination was otherwise normal. Recommendation:           - Patient has a contact number available for                            emergencies. The signs and symptoms of potential                            delayed complications were discussed with the                            patient. Return to normal activities tomorrow.                            Written discharge instructions were provided to the                            patient.                           -  Resume previous diet.                           - Continue present medications.                           - Await pathology results.                           - Repeat colonoscopy is recommended for                            surveillance. The  colonoscopy date will be                            determined after pathology results from today's                            exam become available for review. Remo Lipps P. Catalino Plascencia MD, MD 07/24/2017 12:13:57 PM This report has been signed electronically.

## 2017-07-24 NOTE — Patient Instructions (Signed)
YOU HAD AN ENDOSCOPIC PROCEDURE TODAY AT THE [facility]:   Refer to the procedure report that was given to you for any specific questions about what was found during the examination.  If the procedure report does not answer your questions, please call your gastroenterologist to clarify.  If you requested that your care partner not be given the details of your procedure findings, then the procedure report has been included in a sealed envelope for you to review at your convenience later.  YOU SHOULD EXPECT: Some feelings of bloating in the abdomen. Passage of more gas than usual.  Walking can help get rid of the air that was put into your GI tract during the procedure and reduce the bloating. If you had a lower endoscopy (such as a colonoscopy or flexible sigmoidoscopy) you may notice spotting of blood in your stool or on the toilet paper. If you underwent a bowel prep for your procedure, you may not have a normal bowel movement for a few days.  Please Note:  You might notice some irritation and congestion in your nose or some drainage.  This is from the oxygen used during your procedure.  There is no need for concern and it should clear up in a day or so.  SYMPTOMS TO REPORT IMMEDIATELY:   Following lower endoscopy (colonoscopy or flexible sigmoidoscopy):  Excessive amounts of blood in the stool  Significant tenderness or worsening of abdominal pains  Swelling of the abdomen that is new, acute  Fever of 100F or higher   For urgent or emergent issues, a gastroenterologist can be reached at any hour by calling [phone].   DIET:  We do recommend a small meal at first, but then you may proceed to your regular diet.  Drink plenty of fluids but you should avoid alcoholic beverages for 24 hours. Try to increase the fiber in your diet, and drink plenty of water.  ACTIVITY:  You should plan to take it easy for the rest of today and you should NOT DRIVE or use heavy machinery until  tomorrow (because of the sedation medicines used during the test).    FOLLOW UP: Our staff will call the number listed on your records the next business day following your procedure to check on you and address any questions or concerns that you may have regarding the information given to you following your procedure. If we do not reach you, we will leave a message.  However, if you are feeling well and you are not experiencing any problems, there is no need to return our call.  We will assume that you have returned to your regular daily activities without incident.  If any biopsies were taken you will be contacted by phone or by letter within the next 1-3 weeks.  Please call us at [phone] if you have not heard about the biopsies in 3 weeks.    SIGNATURES/CONFIDENTIALITY: You and/or your care partner have signed paperwork which will be entered into your electronic medical record.  These signatures attest to the fact that that the information above on your After Visit Summary has been reviewed and is understood.  Full responsibility of the confidentiality of this discharge information lies with you and/or your care-partner.  Read all of the handouts given to you by your recovery room nurse. 

## 2017-07-24 NOTE — Progress Notes (Signed)
Pt's states no medical or surgical changes since previsit or office visit. 

## 2017-07-24 NOTE — Progress Notes (Signed)
Called to room to assist during endoscopic procedure.  Patient ID and intended procedure confirmed with present staff. Received instructions for my participation in the procedure from the performing physician.  

## 2017-07-25 ENCOUNTER — Telehealth: Payer: Self-pay

## 2017-07-25 NOTE — Telephone Encounter (Signed)
  Follow up Call-  Call back number 07/24/2017  Post procedure Call Back phone  # [phone]  Permission to leave phone message Yes  Some recent data might be hidden     Patient questions:  Do you have a fever, pain , or abdominal swelling? No. Pain Score  0 *  Have you tolerated food without any problems? Yes.    Have you been able to return to your normal activities? Yes.    Do you have any questions about your discharge instructions: Diet   No. Medications  No. Follow up visit  No.  Do you have questions or concerns about your Care? No.  Actions: * If pain score is 4 or above: No action needed, pain <4.

## 2017-07-27 ENCOUNTER — Encounter: Payer: Self-pay | Admitting: Gastroenterology

## 2017-07-29 ENCOUNTER — Other Ambulatory Visit: Payer: Self-pay | Admitting: Family Medicine

## 2017-08-02 DIAGNOSIS — M5386 Other specified dorsopathies, lumbar region: Secondary | ICD-10-CM | POA: Diagnosis not present

## 2017-08-02 DIAGNOSIS — M9902 Segmental and somatic dysfunction of thoracic region: Secondary | ICD-10-CM | POA: Diagnosis not present

## 2017-08-02 DIAGNOSIS — M9908 Segmental and somatic dysfunction of rib cage: Secondary | ICD-10-CM | POA: Diagnosis not present

## 2017-08-02 DIAGNOSIS — M9901 Segmental and somatic dysfunction of cervical region: Secondary | ICD-10-CM | POA: Diagnosis not present

## 2017-09-06 DIAGNOSIS — M9902 Segmental and somatic dysfunction of thoracic region: Secondary | ICD-10-CM | POA: Diagnosis not present

## 2017-09-06 DIAGNOSIS — M9901 Segmental and somatic dysfunction of cervical region: Secondary | ICD-10-CM | POA: Diagnosis not present

## 2017-09-06 DIAGNOSIS — M5386 Other specified dorsopathies, lumbar region: Secondary | ICD-10-CM | POA: Diagnosis not present

## 2017-09-06 DIAGNOSIS — M9908 Segmental and somatic dysfunction of rib cage: Secondary | ICD-10-CM | POA: Diagnosis not present

## 2017-10-10 DIAGNOSIS — M9901 Segmental and somatic dysfunction of cervical region: Secondary | ICD-10-CM | POA: Diagnosis not present

## 2017-10-10 DIAGNOSIS — M9902 Segmental and somatic dysfunction of thoracic region: Secondary | ICD-10-CM | POA: Diagnosis not present

## 2017-10-10 DIAGNOSIS — M5386 Other specified dorsopathies, lumbar region: Secondary | ICD-10-CM | POA: Diagnosis not present

## 2017-10-10 DIAGNOSIS — M9908 Segmental and somatic dysfunction of rib cage: Secondary | ICD-10-CM | POA: Diagnosis not present

## 2017-11-07 DIAGNOSIS — M9908 Segmental and somatic dysfunction of rib cage: Secondary | ICD-10-CM | POA: Diagnosis not present

## 2017-11-07 DIAGNOSIS — M5386 Other specified dorsopathies, lumbar region: Secondary | ICD-10-CM | POA: Diagnosis not present

## 2017-11-07 DIAGNOSIS — M9901 Segmental and somatic dysfunction of cervical region: Secondary | ICD-10-CM | POA: Diagnosis not present

## 2017-11-07 DIAGNOSIS — M9902 Segmental and somatic dysfunction of thoracic region: Secondary | ICD-10-CM | POA: Diagnosis not present

## 2017-12-05 DIAGNOSIS — M9901 Segmental and somatic dysfunction of cervical region: Secondary | ICD-10-CM | POA: Diagnosis not present

## 2017-12-05 DIAGNOSIS — M9908 Segmental and somatic dysfunction of rib cage: Secondary | ICD-10-CM | POA: Diagnosis not present

## 2017-12-05 DIAGNOSIS — M5386 Other specified dorsopathies, lumbar region: Secondary | ICD-10-CM | POA: Diagnosis not present

## 2017-12-05 DIAGNOSIS — M9902 Segmental and somatic dysfunction of thoracic region: Secondary | ICD-10-CM | POA: Diagnosis not present

## 2017-12-29 ENCOUNTER — Other Ambulatory Visit: Payer: Self-pay | Admitting: Family Medicine

## 2018-01-02 DIAGNOSIS — M9901 Segmental and somatic dysfunction of cervical region: Secondary | ICD-10-CM | POA: Diagnosis not present

## 2018-01-02 DIAGNOSIS — M9908 Segmental and somatic dysfunction of rib cage: Secondary | ICD-10-CM | POA: Diagnosis not present

## 2018-01-02 DIAGNOSIS — M5386 Other specified dorsopathies, lumbar region: Secondary | ICD-10-CM | POA: Diagnosis not present

## 2018-01-02 DIAGNOSIS — M9902 Segmental and somatic dysfunction of thoracic region: Secondary | ICD-10-CM | POA: Diagnosis not present

## 2018-01-22 ENCOUNTER — Other Ambulatory Visit: Payer: Self-pay | Admitting: Family Medicine

## 2018-01-23 DIAGNOSIS — M9908 Segmental and somatic dysfunction of rib cage: Secondary | ICD-10-CM | POA: Diagnosis not present

## 2018-01-23 DIAGNOSIS — M5386 Other specified dorsopathies, lumbar region: Secondary | ICD-10-CM | POA: Diagnosis not present

## 2018-01-23 DIAGNOSIS — M9902 Segmental and somatic dysfunction of thoracic region: Secondary | ICD-10-CM | POA: Diagnosis not present

## 2018-01-23 DIAGNOSIS — M9901 Segmental and somatic dysfunction of cervical region: Secondary | ICD-10-CM | POA: Diagnosis not present

## 2018-02-06 DIAGNOSIS — M9908 Segmental and somatic dysfunction of rib cage: Secondary | ICD-10-CM | POA: Diagnosis not present

## 2018-02-06 DIAGNOSIS — M9901 Segmental and somatic dysfunction of cervical region: Secondary | ICD-10-CM | POA: Diagnosis not present

## 2018-02-06 DIAGNOSIS — M5386 Other specified dorsopathies, lumbar region: Secondary | ICD-10-CM | POA: Diagnosis not present

## 2018-02-06 DIAGNOSIS — M9902 Segmental and somatic dysfunction of thoracic region: Secondary | ICD-10-CM | POA: Diagnosis not present

## 2018-02-15 ENCOUNTER — Other Ambulatory Visit: Payer: Self-pay | Admitting: Family Medicine

## 2018-03-12 ENCOUNTER — Ambulatory Visit (INDEPENDENT_AMBULATORY_CARE_PROVIDER_SITE_OTHER): Payer: BLUE CROSS/BLUE SHIELD | Admitting: Family Medicine

## 2018-03-12 ENCOUNTER — Encounter: Payer: Self-pay | Admitting: Family Medicine

## 2018-03-12 ENCOUNTER — Other Ambulatory Visit: Payer: Self-pay

## 2018-03-12 VITALS — BP 108/70 | HR 72 | Temp 98.5°F | Ht 66.0 in | Wt 200.0 lb

## 2018-03-12 DIAGNOSIS — Z125 Encounter for screening for malignant neoplasm of prostate: Secondary | ICD-10-CM | POA: Diagnosis not present

## 2018-03-12 DIAGNOSIS — Z23 Encounter for immunization: Secondary | ICD-10-CM | POA: Diagnosis not present

## 2018-03-12 DIAGNOSIS — M9902 Segmental and somatic dysfunction of thoracic region: Secondary | ICD-10-CM | POA: Diagnosis not present

## 2018-03-12 DIAGNOSIS — Z0000 Encounter for general adult medical examination without abnormal findings: Secondary | ICD-10-CM

## 2018-03-12 DIAGNOSIS — M9908 Segmental and somatic dysfunction of rib cage: Secondary | ICD-10-CM | POA: Diagnosis not present

## 2018-03-12 DIAGNOSIS — M5386 Other specified dorsopathies, lumbar region: Secondary | ICD-10-CM | POA: Diagnosis not present

## 2018-03-12 DIAGNOSIS — M9901 Segmental and somatic dysfunction of cervical region: Secondary | ICD-10-CM | POA: Diagnosis not present

## 2018-03-12 LAB — CBC WITH DIFFERENTIAL/PLATELET
Basophils Absolute: 0 10*3/uL (ref 0.0–0.1)
Basophils Relative: 0.4 % (ref 0.0–3.0)
EOS PCT: 2.1 % (ref 0.0–5.0)
Eosinophils Absolute: 0.2 10*3/uL (ref 0.0–0.7)
HCT: 45.1 % (ref 39.0–52.0)
Hemoglobin: 15.6 g/dL (ref 13.0–17.0)
Lymphocytes Relative: 37 % (ref 12.0–46.0)
Lymphs Abs: 2.7 10*3/uL (ref 0.7–4.0)
MCHC: 34.6 g/dL (ref 30.0–36.0)
MCV: 86.1 fl (ref 78.0–100.0)
MONO ABS: 0.6 10*3/uL (ref 0.1–1.0)
Monocytes Relative: 8.1 % (ref 3.0–12.0)
NEUTROS PCT: 52.4 % (ref 43.0–77.0)
Neutro Abs: 3.8 10*3/uL (ref 1.4–7.7)
Platelets: 256 10*3/uL (ref 150.0–400.0)
RBC: 5.24 Mil/uL (ref 4.22–5.81)
RDW: 12.9 % (ref 11.5–15.5)
WBC: 7.3 10*3/uL (ref 4.0–10.5)

## 2018-03-12 LAB — LIPID PANEL
Cholesterol: 180 mg/dL (ref 0–200)
HDL: 34.2 mg/dL — AB (ref >39.00)
NONHDL: 145.77
TRIGLYCERIDES: 381 mg/dL — AB (ref 0.0–149.0)
Total CHOL/HDL Ratio: 5
VLDL: 76.2 mg/dL — ABNORMAL HIGH (ref 0.0–40.0)

## 2018-03-12 LAB — HEPATIC FUNCTION PANEL
ALK PHOS: 76 U/L (ref 39–117)
ALT: 25 U/L (ref 0–53)
AST: 15 U/L (ref 0–37)
Albumin: 4.6 g/dL (ref 3.5–5.2)
BILIRUBIN DIRECT: 0.1 mg/dL (ref 0.0–0.3)
BILIRUBIN TOTAL: 0.4 mg/dL (ref 0.2–1.2)
TOTAL PROTEIN: 7 g/dL (ref 6.0–8.3)

## 2018-03-12 LAB — BASIC METABOLIC PANEL
BUN: 20 mg/dL (ref 6–23)
CALCIUM: 9.7 mg/dL (ref 8.4–10.5)
CHLORIDE: 104 meq/L (ref 96–112)
CO2: 29 meq/L (ref 19–32)
Creatinine, Ser: 1.27 mg/dL (ref 0.40–1.50)
GFR: 63.41 mL/min (ref >60.00)
Glucose, Bld: 86 mg/dL (ref 70–99)
Potassium: 4.8 mEq/L (ref 3.5–5.1)
Sodium: 140 mEq/L (ref 135–145)

## 2018-03-12 LAB — LDL CHOLESTEROL, DIRECT: Direct LDL: 104 mg/dL

## 2018-03-12 LAB — PSA: PSA: 2.32 ng/mL (ref 0.10–4.00)

## 2018-03-12 LAB — HEMOGLOBIN A1C: Hgb A1c MFr Bld: 5.9 % (ref 4.6–6.5)

## 2018-03-12 LAB — TSH: TSH: 1.88 u[IU]/mL (ref 0.35–4.50)

## 2018-03-12 MED ORDER — BENAZEPRIL HCL 20 MG PO TABS: 20.0000 mg | ORAL_TABLET | Freq: Every day | ORAL | 3 refills | Status: DC

## 2018-03-12 MED ORDER — ATORVASTATIN CALCIUM 10 MG PO TABS: ORAL_TABLET | ORAL | 3 refills | Status: DC

## 2018-03-12 NOTE — Progress Notes (Signed)
  Subjective:     Patient ID: Corey [name], male   DOB: [birth year], 52 y.o.   MRN: [id number]  HPI Patient seen for physical exam.  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  He has history of hyperlipidemia treated with Lipitor.  Hypertension treated with benazepril.  His father had coronary disease in his late 22s.  Patient has never had any chest pain issues.  He has history of GERD which is controlled with lifestyle management.  Needs flu vaccine.  Tetanus up-to-date.  He had colonoscopy last year with recommended 3-year follow-up.  No history of shingles vaccine  Past Medical History:  Diagnosis Date  . DEPRESSION 09/08/2009  . GERD 09/08/2009  . HYPERLIPIDEMIA 09/08/2009  . HYPERTENSION 09/08/2009  . Kidney stones   . NEPHROLITHIASIS, HX OF 09/08/2009  . PREDIABETES 09/08/2009   Past Surgical History:  Procedure Laterality Date  . COLONOSCOPY  07/2017  . lithrotripsy    . stent before lithrotripsy    . WISDOM TOOTH EXTRACTION     18 years    reports that he has never smoked. He has never used smokeless tobacco. He reports that he drinks alcohol. He reports that he does not use drugs. family history includes Colon polyps in his father and mother; Diabetes in his father. No Known Allergies   Review of Systems  Constitutional: Negative for activity change, appetite change, fatigue and fever.  HENT: Negative for congestion, ear pain and trouble swallowing.   Eyes: Negative for pain and visual disturbance.  Respiratory: Negative for cough, shortness of breath and wheezing.   Cardiovascular: Negative for chest pain and palpitations.  Gastrointestinal: Negative for abdominal distention, abdominal pain, blood in stool, constipation, diarrhea, nausea, rectal pain and vomiting.  Genitourinary: Negative for dysuria, hematuria and testicular pain.  Musculoskeletal: Negative for arthralgias and joint swelling.  Skin: Negative for rash.  Neurological: Negative  for dizziness, syncope and headaches.  Hematological: Negative for adenopathy.  Psychiatric/Behavioral: Negative for confusion and dysphoric mood.       Objective:   Physical Exam  Constitutional: He is oriented to person, place, and time. He appears well-developed and well-nourished. No distress.  HENT:  Head: Normocephalic and atraumatic.  Right Ear: External ear normal.  Left Ear: External ear normal.  Mouth/Throat: Oropharynx is clear and moist.  Eyes: Pupils are equal, round, and reactive to light. Conjunctivae and EOM are normal.  Neck: Normal range of motion. Neck supple. No thyromegaly present.  Cardiovascular: Normal rate, regular rhythm and normal heart sounds.  No murmur heard. Pulmonary/Chest: No respiratory distress. He has no wheezes. He has no rales.  Abdominal: Soft. Bowel sounds are normal. He exhibits no distension and no mass. There is no tenderness. There is no rebound and no guarding.  Musculoskeletal: He exhibits no edema.  Lymphadenopathy:    He has no cervical adenopathy.  Neurological: He is alert and oriented to person, place, and time. He displays normal reflexes. No cranial nerve deficit.  Skin: No rash noted.  Psychiatric: He has a normal mood and affect.       Assessment:     Physical exam.  The following issues were addressed    Plan:     -Flu vaccine given -Refilled regular medications for 1 year -Check lab work.  Will include A1c with positive family history of diabetes -Discussed shingles vaccine and he will check on insurance coverage  Eulas Post MD [facility]

## 2018-03-12 NOTE — Addendum Note (Signed)
Addended by: Anibal Henderson on: 03/12/2018 02:31 PM   Modules accepted: Orders

## 2018-04-09 DIAGNOSIS — M9908 Segmental and somatic dysfunction of rib cage: Secondary | ICD-10-CM | POA: Diagnosis not present

## 2018-04-09 DIAGNOSIS — M5386 Other specified dorsopathies, lumbar region: Secondary | ICD-10-CM | POA: Diagnosis not present

## 2018-04-09 DIAGNOSIS — M9902 Segmental and somatic dysfunction of thoracic region: Secondary | ICD-10-CM | POA: Diagnosis not present

## 2018-04-09 DIAGNOSIS — M9901 Segmental and somatic dysfunction of cervical region: Secondary | ICD-10-CM | POA: Diagnosis not present

## 2018-05-07 DIAGNOSIS — M9902 Segmental and somatic dysfunction of thoracic region: Secondary | ICD-10-CM | POA: Diagnosis not present

## 2018-05-07 DIAGNOSIS — M9908 Segmental and somatic dysfunction of rib cage: Secondary | ICD-10-CM | POA: Diagnosis not present

## 2018-05-07 DIAGNOSIS — M5386 Other specified dorsopathies, lumbar region: Secondary | ICD-10-CM | POA: Diagnosis not present

## 2018-05-07 DIAGNOSIS — M9901 Segmental and somatic dysfunction of cervical region: Secondary | ICD-10-CM | POA: Diagnosis not present

## 2018-06-04 DIAGNOSIS — M5386 Other specified dorsopathies, lumbar region: Secondary | ICD-10-CM | POA: Diagnosis not present

## 2018-06-04 DIAGNOSIS — M9901 Segmental and somatic dysfunction of cervical region: Secondary | ICD-10-CM | POA: Diagnosis not present

## 2018-06-04 DIAGNOSIS — M9902 Segmental and somatic dysfunction of thoracic region: Secondary | ICD-10-CM | POA: Diagnosis not present

## 2018-06-04 DIAGNOSIS — M9908 Segmental and somatic dysfunction of rib cage: Secondary | ICD-10-CM | POA: Diagnosis not present

## 2018-07-09 DIAGNOSIS — M9901 Segmental and somatic dysfunction of cervical region: Secondary | ICD-10-CM | POA: Diagnosis not present

## 2018-07-09 DIAGNOSIS — M9902 Segmental and somatic dysfunction of thoracic region: Secondary | ICD-10-CM | POA: Diagnosis not present

## 2018-07-09 DIAGNOSIS — M9908 Segmental and somatic dysfunction of rib cage: Secondary | ICD-10-CM | POA: Diagnosis not present

## 2019-03-13 ENCOUNTER — Other Ambulatory Visit: Payer: Self-pay | Admitting: Family Medicine

## 2019-03-14 ENCOUNTER — Other Ambulatory Visit: Payer: Self-pay | Admitting: Family Medicine

## 2019-03-25 ENCOUNTER — Other Ambulatory Visit: Payer: Self-pay

## 2019-03-25 ENCOUNTER — Ambulatory Visit (INDEPENDENT_AMBULATORY_CARE_PROVIDER_SITE_OTHER): Payer: BC Managed Care – PPO | Admitting: Family Medicine

## 2019-03-25 ENCOUNTER — Encounter: Payer: Self-pay | Admitting: Family Medicine

## 2019-03-25 VITALS — BP 118/80 | HR 60 | Temp 98.3°F | Resp 16 | Ht 66.0 in | Wt 204.8 lb

## 2019-03-25 DIAGNOSIS — Z125 Encounter for screening for malignant neoplasm of prostate: Secondary | ICD-10-CM | POA: Diagnosis not present

## 2019-03-25 DIAGNOSIS — D126 Benign neoplasm of colon, unspecified: Secondary | ICD-10-CM | POA: Diagnosis not present

## 2019-03-25 DIAGNOSIS — Z0000 Encounter for general adult medical examination without abnormal findings: Secondary | ICD-10-CM

## 2019-03-25 DIAGNOSIS — Z23 Encounter for immunization: Secondary | ICD-10-CM | POA: Diagnosis not present

## 2019-03-25 LAB — LIPID PANEL
Cholesterol: 232 mg/dL — ABNORMAL HIGH (ref 0–200)
HDL: 34.6 mg/dL — ABNORMAL LOW (ref >39.00)
Total CHOL/HDL Ratio: 7
Triglycerides: 440 mg/dL — ABNORMAL HIGH (ref 0.0–149.0)

## 2019-03-25 LAB — CBC WITH DIFFERENTIAL/PLATELET
Basophils Absolute: 0 10*3/uL (ref 0.0–0.1)
Basophils Relative: 0.5 % (ref 0.0–3.0)
Eosinophils Absolute: 0.2 10*3/uL (ref 0.0–0.7)
Eosinophils Relative: 2.1 % (ref 0.0–5.0)
HCT: 45 % (ref 39.0–52.0)
Hemoglobin: 15.3 g/dL (ref 13.0–17.0)
Lymphocytes Relative: 30.4 % (ref 12.0–46.0)
Lymphs Abs: 2.4 10*3/uL (ref 0.7–4.0)
MCHC: 34 g/dL (ref 30.0–36.0)
MCV: 87.7 fl (ref 78.0–100.0)
Monocytes Absolute: 0.7 10*3/uL (ref 0.1–1.0)
Monocytes Relative: 8.7 % (ref 3.0–12.0)
Neutro Abs: 4.6 10*3/uL (ref 1.4–7.7)
Neutrophils Relative %: 58.3 % (ref 43.0–77.0)
Platelets: 232 10*3/uL (ref 150.0–400.0)
RBC: 5.13 Mil/uL (ref 4.22–5.81)
RDW: 13.2 % (ref 11.5–15.5)
WBC: 7.9 10*3/uL (ref 4.0–10.5)

## 2019-03-25 LAB — BASIC METABOLIC PANEL
BUN: 27 mg/dL — ABNORMAL HIGH (ref 6–23)
CO2: 24 mEq/L (ref 19–32)
Calcium: 9.3 mg/dL (ref 8.4–10.5)
Chloride: 105 mEq/L (ref 96–112)
Creatinine, Ser: 1.09 mg/dL (ref 0.40–1.50)
GFR: 70.88 mL/min (ref >60.00)
Glucose, Bld: 89 mg/dL (ref 70–99)
Potassium: 4.3 mEq/L (ref 3.5–5.1)
Sodium: 139 mEq/L (ref 135–145)

## 2019-03-25 LAB — HEMOGLOBIN A1C: Hgb A1c MFr Bld: 5.8 % (ref 4.6–6.5)

## 2019-03-25 LAB — HEPATIC FUNCTION PANEL
ALT: 25 U/L (ref 0–53)
AST: 15 U/L (ref 0–37)
Albumin: 4.6 g/dL (ref 3.5–5.2)
Alkaline Phosphatase: 72 U/L (ref 39–117)
Bilirubin, Direct: 0.1 mg/dL (ref 0.0–0.3)
Total Bilirubin: 0.4 mg/dL (ref 0.2–1.2)
Total Protein: 6.8 g/dL (ref 6.0–8.3)

## 2019-03-25 LAB — PSA: PSA: 2.38 ng/mL (ref 0.10–4.00)

## 2019-03-25 LAB — LDL CHOLESTEROL, DIRECT: Direct LDL: 134 mg/dL

## 2019-03-25 LAB — TSH: TSH: 2.1 u[IU]/mL (ref 0.35–4.50)

## 2019-03-25 NOTE — Progress Notes (Signed)
Subjective:     Patient ID: Corey [name], male   DOB: 18-Jul-1965, 53 y.o.   MRN: [id number]  HPI Corey [name] is seen for physical exam.  He has history of hypertension hyperlipidemia.  Family history is significant for both parents with adenomatous polyps.  His dad has heart disease and had a stent he thinks around early 47s.  They have had colonoscopy 2019 with colon adenoma.  Recommended 3-year follow-up.  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  Tetanus is up-to-date.  He has been exercising more at home this past year.  Is not lost weight but has lost inches around the waist.  He has past history of prediabetes and history of type 2 diabetes in several family members  Past Medical History:  Diagnosis Date  . DEPRESSION 09/08/2009  . GERD 09/08/2009  . HYPERLIPIDEMIA 09/08/2009  . HYPERTENSION 09/08/2009  . Kidney stones   . NEPHROLITHIASIS, HX OF 09/08/2009  . PREDIABETES 09/08/2009   Past Surgical History:  Procedure Laterality Date  . COLONOSCOPY  07/2017  . lithrotripsy    . stent before lithrotripsy    . WISDOM TOOTH EXTRACTION     18 years    reports that he has never smoked. He has never used smokeless tobacco. He reports current alcohol use. He reports that he does not use drugs. family history includes Colon polyps in his father and mother; Diabetes in his father; Heart disease in his father. Allergies  Allergen Reactions  . Percocet [Oxycodone-Acetaminophen]     Messes with his mind per pt      Review of Systems  Constitutional: Negative for activity change, appetite change, fatigue, fever and unexpected weight change.  HENT: Negative for congestion, ear pain and trouble swallowing.   Eyes: Negative for pain and visual disturbance.  Respiratory: Negative for cough, shortness of breath and wheezing.   Cardiovascular: Negative for chest  pain and palpitations.  Gastrointestinal: Negative for abdominal distention, abdominal pain, blood in stool, constipation, diarrhea, nausea, rectal pain and vomiting.  Endocrine: Negative for polydipsia and polyuria.  Genitourinary: Negative for dysuria, hematuria and testicular pain.  Musculoskeletal: Negative for arthralgias and joint swelling.  Skin: Negative for rash.  Neurological: Negative for dizziness, syncope and headaches.  Hematological: Negative for adenopathy.  Psychiatric/Behavioral: Negative for confusion and dysphoric mood.       Objective:   Physical Exam Constitutional:      General: He is not in acute distress.    Appearance: He is well-developed.  HENT:     Head: Normocephalic and atraumatic.     Right Ear: External ear normal.     Left Ear: External ear normal.  Eyes:     Conjunctiva/sclera: Conjunctivae normal.     Pupils: Pupils are equal, round, and reactive to light.  Neck:     Musculoskeletal: Normal range of motion and neck supple.     Thyroid: No thyromegaly.  Cardiovascular:     Rate and Rhythm: Normal rate and regular rhythm.     Heart sounds: Normal heart sounds. No murmur.  Pulmonary:     Effort: No respiratory distress.     Breath sounds: No wheezing or rales.  Abdominal:     General: Bowel sounds are normal. There is no distension.     Palpations: Abdomen is soft. There is no mass.     Tenderness: There  is no abdominal tenderness. There is no guarding or rebound.  Lymphadenopathy:     Cervical: No cervical adenopathy.  Skin:    Findings: No rash.  Neurological:     Mental Status: He is alert and oriented to person, place, and time.     Cranial Nerves: No cranial nerve deficit.     Deep Tendon Reflexes: Reflexes normal.        Assessment:     Physical exam.  He has hypertension which is stable.  History of hyperlipidemia.  At risk for type 2 diabetes.    Plan:     -Check labs including A1c -Flu vaccine given -Continue regular  exercise habits -Continue current medications -Repeat colonoscopy 2022  Corey Post MD [facility]

## 2019-04-06 ENCOUNTER — Other Ambulatory Visit: Payer: Self-pay | Admitting: Family Medicine

## 2019-07-18 ENCOUNTER — Other Ambulatory Visit: Payer: Self-pay

## 2019-07-18 MED ORDER — BENAZEPRIL HCL 20 MG PO TABS: 20.0000 mg | ORAL_TABLET | Freq: Every day | ORAL | 0 refills | Status: DC

## 2019-10-09 ENCOUNTER — Other Ambulatory Visit: Payer: Self-pay | Admitting: Family Medicine

## 2019-11-04 ENCOUNTER — Other Ambulatory Visit: Payer: Self-pay

## 2019-11-05 ENCOUNTER — Ambulatory Visit (INDEPENDENT_AMBULATORY_CARE_PROVIDER_SITE_OTHER): Payer: 59 | Admitting: Family Medicine

## 2019-11-05 ENCOUNTER — Encounter: Payer: Self-pay | Admitting: Family Medicine

## 2019-11-05 VITALS — BP 132/78 | HR 85 | Temp 98.6°F | Wt 202.3 lb

## 2019-11-05 DIAGNOSIS — I10 Essential (primary) hypertension: Secondary | ICD-10-CM | POA: Diagnosis not present

## 2019-11-05 DIAGNOSIS — R350 Frequency of micturition: Secondary | ICD-10-CM | POA: Diagnosis not present

## 2019-11-05 LAB — POCT URINALYSIS DIPSTICK
Bilirubin, UA: NEGATIVE
Blood, UA: NEGATIVE
Clarity, UA: NEGATIVE
Glucose, UA: NEGATIVE
Ketones, UA: NEGATIVE
Leukocytes, UA: NEGATIVE
Nitrite, UA: NEGATIVE
Protein, UA: NEGATIVE
Spec Grav, UA: 1.02 (ref 1.010–1.025)
Urobilinogen, UA: 0.2 E.U./dL
pH, UA: 6 (ref 5.0–8.0)

## 2019-11-05 NOTE — Progress Notes (Signed)
Established Patient Office Visit  Subjective:  Patient ID: Corey [name], male    DOB: [birth year]  Age: 54 y.o. MRN: [id number]  CC:  Chief Complaint  Patient presents with  . Urinary Frequency    pt states for the last year hes notified urinary frequency but in the past week it has not been as bad     HPI Corey [name] presents for complaints for about a year now some increased urine frequency.  He thinks symptoms may be stress related.  He generally goes to bathroom out 6-8 times per day.  Usually only gets about once at night.  Denies any slow stream.  Is had some increased urgency.  No burning with urination.  Past history of kidney stones.  No gross hematuria.  No polydipsia or any major weight changes.  Recent A1c 5.8%.  He does drink about 4 cups of tea per day this is his major form of caffeine consumption. Sometimes has a sense of urine urgency.  No major incontinence.  He has hypertension which is treated with benazepril and takes atorvastatin for hyperlipidemia.  No diuretic use.  Recent labs from the fall were reviewed    Past Medical History:  Diagnosis Date  . DEPRESSION 09/08/2009  . GERD 09/08/2009  . HYPERLIPIDEMIA 09/08/2009  . HYPERTENSION 09/08/2009  . Kidney stones   . NEPHROLITHIASIS, HX OF 09/08/2009  . PREDIABETES 09/08/2009    Past Surgical History:  Procedure Laterality Date  . COLONOSCOPY  07/2017  . lithrotripsy    . stent before lithrotripsy    . WISDOM TOOTH EXTRACTION     19 years    Family History  Problem Relation Age of Onset  . Diabetes Father   . Colon polyps Father   . Heart disease Father   . Colon polyps Mother   . Hyperlipidemia Neg Hx        family hx  . Colon cancer Neg Hx   . Esophageal cancer Neg Hx   . Rectal cancer Neg Hx   . Stomach cancer Neg Hx     Social History   Socioeconomic History  . Marital status: Significant Other    Spouse name: Not on file  . Number of children: Not on file  . Years of education: Not  on file  . Highest education level: Not on file  Occupational History  . Not on file  Tobacco Use  . Smoking status: Never Smoker  . Smokeless tobacco: Never Used  Vaping Use  . Vaping Use: Never used  Substance and Sexual Activity  . Alcohol use: Yes    Comment: 2-3 wine and liquor a week  . Drug use: No  . Sexual activity: Not on file  Other Topics Concern  . Not on file  Social History Narrative  . Not on file   Social Determinants of Health   Financial Resource Strain:   . Difficulty of Paying Living Expenses:   Food Insecurity:   . Worried About Charity fundraiser in the Last Year:   . Arboriculturist in the Last Year:   Transportation Needs:   . Film/video editor (Medical):   Marland Kitchen Lack of Transportation (Non-Medical):   Physical Activity:   . Days of Exercise per Week:   . Minutes of Exercise per Session:   Stress:   . Feeling of Stress :   Social Connections:   . Frequency of Communication with Friends and Family:   . Frequency  of Social Gatherings with Friends and Family:   . Attends Religious Services:   . Active Member of Clubs or Organizations:   . Attends Archivist Meetings:   Marland Kitchen Marital Status:   Intimate Partner Violence:   . Fear of Current or Ex-Partner:   . Emotionally Abused:   Marland Kitchen Physically Abused:   . Sexually Abused:     Outpatient Medications Prior to Visit  Medication Sig Dispense Refill  . atorvastatin (LIPITOR) 10 MG tablet TAKE 1 TABLET BY MOUTH EVERY DAY AT 6PM 90 tablet 3  . benazepril (LOTENSIN) 20 MG tablet TAKE 1 TABLET BY MOUTH EVERY DAY 90 tablet 0  . Multiple Vitamin (MULTIVITAMIN WITH MINERALS) TABS Take 1 tablet by mouth daily.     Facility-Administered Medications Prior to Visit  Medication Dose Route Frequency Provider Last Rate Last Admin  . 0.9 %  sodium chloride infusion  500 mL Intravenous Once Armbruster, Carlota Raspberry, MD        Allergies  Allergen Reactions  . Percocet [Oxycodone-Acetaminophen]      Messes with his mind per pt    ROS Review of Systems  Constitutional: Negative for appetite change, chills, fever and unexpected weight change.  Gastrointestinal: Negative for abdominal pain.  Genitourinary: Positive for frequency and urgency. Negative for decreased urine volume, difficulty urinating, hematuria and testicular pain.  Musculoskeletal: Negative for back pain.      Objective:    Physical Exam Vitals reviewed.  Constitutional:      Appearance: Normal appearance.  Cardiovascular:     Rate and Rhythm: Normal rate and regular rhythm.  Pulmonary:     Effort: Pulmonary effort is normal.     Breath sounds: Normal breath sounds.  Neurological:     Mental Status: He is alert.     BP 132/78 (BP Location: Left Arm, Patient Position: Sitting, Cuff Size: Normal)   Pulse 85   Temp 98.6 F (37 C) (Temporal)   Wt 202 lb 4.8 oz (91.8 kg)   SpO2 96%   BMI 32.65 kg/m  Wt Readings from Last 3 Encounters:  11/05/19 202 lb 4.8 oz (91.8 kg)  03/25/19 204 lb 12.8 oz (92.9 kg)  03/12/18 200 lb (90.7 kg)     Health Maintenance Due  Topic Date Due  . Hepatitis C Screening  Never done  . COVID-19 Vaccine (1) Never done  . HIV Screening  Never done    There are no preventive care reminders to display for this patient.  Lab Results  Component Value Date   TSH 2.10 03/25/2019   Lab Results  Component Value Date   WBC 7.9 03/25/2019   HGB 15.3 03/25/2019   HCT 45.0 03/25/2019   MCV 87.7 03/25/2019   PLT 232.0 03/25/2019   Lab Results  Component Value Date   NA 139 03/25/2019   K 4.3 03/25/2019   CO2 24 03/25/2019   GLUCOSE 89 03/25/2019   BUN 27 (H) 03/25/2019   CREATININE 1.09 03/25/2019   BILITOT 0.4 03/25/2019   ALKPHOS 72 03/25/2019   AST 15 03/25/2019   ALT 25 03/25/2019   PROT 6.8 03/25/2019   ALBUMIN 4.6 03/25/2019   CALCIUM 9.3 03/25/2019   GFR 70.88 03/25/2019   Lab Results  Component Value Date   CHOL 232 (H) 03/25/2019   Lab Results   Component Value Date   HDL 34.60 (L) 03/25/2019   Lab Results  Component Value Date   LDLCALC 87 01/25/2017   Lab Results  Component  Value Date   TRIG (H) 03/25/2019    440.0 Triglyceride is over 400; calculations on Lipids are invalid.   Lab Results  Component Value Date   CHOLHDL 7 03/25/2019   Lab Results  Component Value Date   HGBA1C 5.8 03/25/2019      Assessment & Plan:   #1 urine frequency.  Low clinical suspicion for UTI.  No history of diabetes.  No diuretic use.  Does drink caffeine use as above.  Denies any obstructive urinary symptoms.  He had PSA last fall which was stable  -Urine dipstick today is clear. -We discussed reducing caffeine intake and avoid excessive fluids late at night -We did discuss medications such as anticholinergics or Myrbetriq for urine urgency symptoms but at this point he would like to try caffeine reduction first which seems reasonable  #2 hypertension- stable  No orders of the defined types were placed in this encounter.   Follow-up: No follow-ups on file.    Carolann Littler, MD

## 2019-11-05 NOTE — Patient Instructions (Signed)
Urinary Frequency, Adult Urinary frequency means urinating more often than usual. You may urinate every 1-2 hours even though you drink a normal amount of fluid and do not have a bladder infection or condition. Although you urinate more often than normal, the total amount of urine produced in a day is normal. With urinary frequency, you may have an urgent need to urinate often. The stress and anxiety of needing to find a bathroom quickly can make this urge worse. This condition may go away on its own or you may need treatment at home. Home treatment may include bladder training, exercises, taking medicines, or making changes to your diet. Follow these instructions at home: Bladder health   Keep a bladder diary if told by your health care provider. Keep track of: ? What you eat and drink. ? How often you urinate. ? How much you urinate.  Follow a bladder training program if told by your health care provider. This may include: ? Learning to delay going to the bathroom. ? Double urinating (voiding). This helps if you are not completely emptying your bladder. ? Scheduled voiding.  Do Kegel exercises as told by your health care provider. Kegel exercises strengthen the muscles that help control urination, which may help the condition. Eating and drinking  If told by your health care provider, make diet changes, such as: ? Avoiding caffeine. ? Drinking fewer fluids, especially alcohol. ? Not drinking in the evening. ? Avoiding foods or drinks that may irritate the bladder. These include coffee, tea, soda, artificial sweeteners, citrus, tomato-based foods, and chocolate. ? Eating foods that help prevent or ease constipation. Constipation can make this condition worse. Your health care provider may recommend that you:  Drink enough fluid to keep your urine pale yellow.  Take over-the-counter or prescription medicines.  Eat foods that are high in fiber, such as beans, whole grains, and fresh  fruits and vegetables.  Limit foods that are high in fat and processed sugars, such as fried or sweet foods. General instructions  Take over-the-counter and prescription medicines only as told by your health care provider.  Keep all follow-up visits as told by your health care provider. This is important. Contact a health care provider if:  You start urinating more often.  You feel pain or irritation when you urinate.  You notice blood in your urine.  Your urine looks cloudy.  You develop a fever.  You begin vomiting. Get help right away if:  You are unable to urinate. Summary  Urinary frequency means urinating more often than usual. With urinary frequency, you may urinate every 1-2 hours even though you drink a normal amount of fluid and do not have a bladder infection or other bladder condition.  Your health care provider may recommend that you keep a bladder diary, follow a bladder training program, or make dietary changes.  If told by your health care provider, do Kegel exercises to strengthen the muscles that help control urination.  Take over-the-counter and prescription medicines only as told by your health care provider.  Contact a health care provider if your symptoms do not improve or get worse. This information is not intended to replace advice given to you by your health care provider. Make sure you discuss any questions you have with your health care provider. Document Revised: 11/09/2017 Document Reviewed: 11/09/2017 Elsevier Patient Education  2020 [employer]  

## 2019-11-13 ENCOUNTER — Encounter: Payer: Self-pay | Admitting: Family Medicine

## 2019-11-13 ENCOUNTER — Ambulatory Visit: Payer: 59 | Admitting: Family Medicine

## 2019-11-13 ENCOUNTER — Other Ambulatory Visit: Payer: Self-pay

## 2019-11-13 VITALS — BP 122/64 | HR 95 | Temp 97.6°F | Wt 200.5 lb

## 2019-11-13 DIAGNOSIS — L290 Pruritus ani: Secondary | ICD-10-CM

## 2019-11-13 NOTE — Patient Instructions (Signed)
Watch for any increased redness, swelling, or pain.  I did not see any evidence for abscess or sebaceous cyst.

## 2019-11-13 NOTE — Progress Notes (Signed)
Established Patient Office Visit  Subjective:  Patient ID: Corey [name], male    DOB: Feb 24, 1966  Age: 54 y.o. MRN: [id number]  CC:  Chief Complaint  Patient presents with  . Mass    on right buttocks comes and goes came back last weekend and noticed some drainage     HPI Corey [name] presents for concern for "bump "on buttocks right side which he states comes and goes.  He had a recent similar area previously.  Last weekend he felt there may been some drainage from the area.  Minimal discomfort initially but none now.  Slight itching now.  No history of pilonidal cyst.  Never had abscess drained.  Past Medical History:  Diagnosis Date  . DEPRESSION 09/08/2009  . GERD 09/08/2009  . HYPERLIPIDEMIA 09/08/2009  . HYPERTENSION 09/08/2009  . Kidney stones   . NEPHROLITHIASIS, HX OF 09/08/2009  . PREDIABETES 09/08/2009    Past Surgical History:  Procedure Laterality Date  . COLONOSCOPY  07/2017  . lithrotripsy    . stent before lithrotripsy    . WISDOM TOOTH EXTRACTION     19 years    Family History  Problem Relation Age of Onset  . Diabetes Father   . Colon polyps Father   . Heart disease Father   . Colon polyps Mother   . Hyperlipidemia Neg Hx        family hx  . Colon cancer Neg Hx   . Esophageal cancer Neg Hx   . Rectal cancer Neg Hx   . Stomach cancer Neg Hx     Social History   Socioeconomic History  . Marital status: Significant Other    Spouse name: Not on file  . Number of children: Not on file  . Years of education: Not on file  . Highest education level: Not on file  Occupational History  . Not on file  Tobacco Use  . Smoking status: Never Smoker  . Smokeless tobacco: Never Used  Vaping Use  . Vaping Use: Never used  Substance and Sexual Activity  . Alcohol use: Yes    Comment: 2-3 wine and liquor a week  . Drug use: No  . Sexual activity: Not on file  Other Topics Concern  . Not on file  Social History Narrative  . Not on file   Social  Determinants of Health   Financial Resource Strain:   . Difficulty of Paying Living Expenses:   Food Insecurity:   . Worried About Charity fundraiser in the Last Year:   . Arboriculturist in the Last Year:   Transportation Needs:   . Film/video editor (Medical):   Marland Kitchen Lack of Transportation (Non-Medical):   Physical Activity:   . Days of Exercise per Week:   . Minutes of Exercise per Session:   Stress:   . Feeling of Stress :   Social Connections:   . Frequency of Communication with Friends and Family:   . Frequency of Social Gatherings with Friends and Family:   . Attends Religious Services:   . Active Member of Clubs or Organizations:   . Attends Archivist Meetings:   Marland Kitchen Marital Status:   Intimate Partner Violence:   . Fear of Current or Ex-Partner:   . Emotionally Abused:   Marland Kitchen Physically Abused:   . Sexually Abused:     Outpatient Medications Prior to Visit  Medication Sig Dispense Refill  . atorvastatin (LIPITOR) 10 MG tablet TAKE  1 TABLET BY MOUTH EVERY DAY AT 6PM 90 tablet 3  . benazepril (LOTENSIN) 20 MG tablet TAKE 1 TABLET BY MOUTH EVERY DAY 90 tablet 0  . Multiple Vitamin (MULTIVITAMIN WITH MINERALS) TABS Take 1 tablet by mouth daily.     Facility-Administered Medications Prior to Visit  Medication Dose Route Frequency Provider Last Rate Last Admin  . 0.9 %  sodium chloride infusion  500 mL Intravenous Once Armbruster, Carlota Raspberry, MD        Allergies  Allergen Reactions  . Percocet [Oxycodone-Acetaminophen]     Messes with his mind per pt    ROS Review of Systems  Constitutional: Negative for chills and fever.  Gastrointestinal: Negative for diarrhea.      Objective:    Physical Exam Vitals reviewed.  Constitutional:      Appearance: Normal appearance.  Cardiovascular:     Rate and Rhythm: Normal rate and regular rhythm.  Genitourinary:    Comments: Small skin tag right side of anus around 3 O'clock area.  No erythema.  No visible  swelling.  No induration or fluctuance.   Neurological:     Mental Status: He is alert.     BP 122/64 (BP Location: Left Arm, Patient Position: Sitting, Cuff Size: Normal)   Pulse 95   Temp 97.6 F (36.4 C) (Temporal)   Wt 200 lb 8 oz (90.9 kg)   SpO2 98%   BMI 32.36 kg/m  Wt Readings from Last 3 Encounters:  11/13/19 200 lb 8 oz (90.9 kg)  11/05/19 202 lb 4.8 oz (91.8 kg)  03/25/19 204 lb 12.8 oz (92.9 kg)     Health Maintenance Due  Topic Date Due  . Hepatitis C Screening  Never done  . COVID-19 Vaccine (1) Never done  . HIV Screening  Never done    There are no preventive care reminders to display for this patient.  Lab Results  Component Value Date   TSH 2.10 03/25/2019   Lab Results  Component Value Date   WBC 7.9 03/25/2019   HGB 15.3 03/25/2019   HCT 45.0 03/25/2019   MCV 87.7 03/25/2019   PLT 232.0 03/25/2019   Lab Results  Component Value Date   NA 139 03/25/2019   K 4.3 03/25/2019   CO2 24 03/25/2019   GLUCOSE 89 03/25/2019   BUN 27 (H) 03/25/2019   CREATININE 1.09 03/25/2019   BILITOT 0.4 03/25/2019   ALKPHOS 72 03/25/2019   AST 15 03/25/2019   ALT 25 03/25/2019   PROT 6.8 03/25/2019   ALBUMIN 4.6 03/25/2019   CALCIUM 9.3 03/25/2019   GFR 70.88 03/25/2019   Lab Results  Component Value Date   CHOL 232 (H) 03/25/2019   Lab Results  Component Value Date   HDL 34.60 (L) 03/25/2019   Lab Results  Component Value Date   LDLCALC 87 01/25/2017   Lab Results  Component Value Date   TRIG (H) 03/25/2019    440.0 Triglyceride is over 400; calculations on Lipids are invalid.   Lab Results  Component Value Date   CHOLHDL 7 03/25/2019   Lab Results  Component Value Date   HGBA1C 5.8 03/25/2019      Assessment & Plan:   Mild perianal irritation.  By exam he has small skin tag right side of anus around 3 o'clock position but we cannot appreciate any erythema, fluctuance, induration, visible drainage, or obvious  swelling.  -Reassurance and observe for now.  Follow-up promptly for any recurrent pain, swelling, drainage  No orders of the defined types were placed in this encounter.   Follow-up: No follow-ups on file.    Carolann Littler, MD

## 2020-01-02 ENCOUNTER — Other Ambulatory Visit: Payer: Self-pay | Admitting: Family Medicine

## 2020-04-06 ENCOUNTER — Encounter: Payer: 59 | Admitting: Family Medicine

## 2020-05-04 ENCOUNTER — Other Ambulatory Visit: Payer: Self-pay

## 2020-05-04 ENCOUNTER — Ambulatory Visit: Payer: 59 | Admitting: Family Medicine

## 2020-05-04 ENCOUNTER — Encounter: Payer: Self-pay | Admitting: Family Medicine

## 2020-05-04 VITALS — BP 116/70 | HR 64 | Ht 66.0 in | Wt 199.0 lb

## 2020-05-04 DIAGNOSIS — Z0000 Encounter for general adult medical examination without abnormal findings: Secondary | ICD-10-CM

## 2020-05-04 MED ORDER — BENAZEPRIL HCL 20 MG PO TABS: 20.0000 mg | ORAL_TABLET | Freq: Every day | ORAL | 3 refills | Status: DC

## 2020-05-04 MED ORDER — ATORVASTATIN CALCIUM 10 MG PO TABS: ORAL_TABLET | ORAL | 3 refills | Status: DC

## 2020-05-04 NOTE — Progress Notes (Signed)
Established Patient Office Visit  Subjective:  Patient ID: Corey [name], male    DOB: 07/19/1965  Age: 54 y.o. MRN: [id number]  CC:  Chief Complaint  Patient presents with  . Annual Exam    HPI Corey [name] presents for annual physical exam..  He has history of hypertension, GERD, hyperlipidemia, and history of kidney stones.  Currently maintained on Urocit, benazepril, and atorvastatin.  Request refills of his usual medications.  He is currently on holiday break.  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  Generally doing well.  He plans to visit her brother up in [place] for the holidays.  Health maintenance reviewed  -Covid vaccines given -Flu vaccine already given -Tetanus due 2022 -Colonoscopy due 3/22 -No history of hepatitis C screening but low risk -No history of shingles vaccine.  Social history-he has significant other partner.  Never smoked.  Rare alcohol.  Usually about 3 alcoholic drinks per month works as a Chief Operating Officer as above  Family history-his mother's had apparently several strokes and question of multi-infarct dementia.  His father has history of type 2 diabetes and heart disease.  He has a brother who has no significant medical problems.  Past Medical History:  Diagnosis Date  . DEPRESSION 09/08/2009  . GERD 09/08/2009  . HYPERLIPIDEMIA 09/08/2009  . HYPERTENSION 09/08/2009  . Kidney stones   . NEPHROLITHIASIS, HX OF 09/08/2009  . PREDIABETES 09/08/2009    Past Surgical History:  Procedure Laterality Date  . COLONOSCOPY  07/2017  . lithrotripsy    . stent before lithrotripsy    . WISDOM TOOTH EXTRACTION     19 years    Family History  Problem Relation Age of Onset  . Diabetes Father   . Colon polyps Father   . Heart disease Father 9       CAD  . Colon polyps Mother   . Stroke Mother   . Dementia Mother   . Hyperlipidemia Neg Hx        family hx  . Colon cancer Neg  Hx   . Esophageal cancer Neg Hx   . Rectal cancer Neg Hx   . Stomach cancer Neg Hx     Social History   Socioeconomic History  . Marital status: Significant Other    Spouse name: Not on file  . Number of children: Not on file  . Years of education: Not on file  . Highest education level: Not on file  Occupational History  . Not on file  Tobacco Use  . Smoking status: Never Smoker  . Smokeless tobacco: Never Used  Vaping Use  . Vaping Use: Never used  Substance and Sexual Activity  . Alcohol use: Yes    Comment: 2-3 wine and liquor a week  . Drug use: No  . Sexual activity: Not on file  Other Topics Concern  . Not on file  Social History Narrative  . Not on file   Social Determinants of Health   Financial Resource Strain: Not on file  Food Insecurity: Not on file  Transportation Needs: Not on file  Physical Activity: Not on file  Stress: Not on file  Social Connections: Not on file  Intimate Partner Violence: Not on file    Outpatient Medications Prior to Visit  Medication Sig Dispense Refill  . Multiple Vitamin (MULTIVITAMIN WITH MINERALS) TABS Take 1 tablet by mouth daily.    . potassium citrate (UROCIT-K) 10 MEQ (  1080 MG) SR tablet Take 10 mEq by mouth 2 (two) times daily.    Marland Kitchen atorvastatin (LIPITOR) 10 MG tablet TAKE 1 TABLET BY MOUTH EVERY DAY AT 6PM 90 tablet 3  . benazepril (LOTENSIN) 20 MG tablet TAKE 1 TABLET BY MOUTH EVERY DAY 90 tablet 0   Facility-Administered Medications Prior to Visit  Medication Dose Route Frequency Provider Last Rate Last Admin  . 0.9 %  sodium chloride infusion  500 mL Intravenous Once Armbruster, Carlota Raspberry, MD        Allergies  Allergen Reactions  . Percocet [Oxycodone-Acetaminophen]     Messes with his mind per pt    ROS Review of Systems  Constitutional: Negative for activity change, appetite change, fatigue and fever.  HENT: Negative for congestion, ear pain and trouble swallowing.   Eyes: Negative for pain and visual  disturbance.  Respiratory: Negative for cough, shortness of breath and wheezing.   Cardiovascular: Negative for chest pain and palpitations.  Gastrointestinal: Negative for abdominal distention, abdominal pain, blood in stool, constipation, diarrhea, nausea, rectal pain and vomiting.  Endocrine: Negative for polydipsia and polyuria.  Genitourinary: Negative for dysuria, hematuria and testicular pain.  Musculoskeletal: Negative for arthralgias and joint swelling.  Skin: Negative for rash.  Neurological: Negative for dizziness, syncope and headaches.  Hematological: Negative for adenopathy.  Psychiatric/Behavioral: Negative for confusion and dysphoric mood.      Objective:    Physical Exam Constitutional:      General: He is not in acute distress.    Appearance: He is well-developed and well-nourished.  HENT:     Head: Normocephalic and atraumatic.     Right Ear: External ear normal.     Left Ear: External ear normal.     Mouth/Throat:     Mouth: Oropharynx is clear and moist.  Eyes:     Extraocular Movements: EOM normal.     Conjunctiva/sclera: Conjunctivae normal.     Pupils: Pupils are equal, round, and reactive to light.  Neck:     Thyroid: No thyromegaly.  Cardiovascular:     Rate and Rhythm: Normal rate and regular rhythm.     Heart sounds: Normal heart sounds. No murmur heard.   Pulmonary:     Effort: No respiratory distress.     Breath sounds: No wheezing or rales.  Abdominal:     General: Bowel sounds are normal. There is no distension.     Palpations: Abdomen is soft. There is no mass.     Tenderness: There is no abdominal tenderness. There is no guarding or rebound.  Musculoskeletal:        General: No edema.     Cervical back: Normal range of motion and neck supple.     Right lower leg: No edema.     Left lower leg: No edema.  Lymphadenopathy:     Cervical: No cervical adenopathy.  Skin:    Findings: No rash.  Neurological:     Mental Status: He is  alert and oriented to person, place, and time.     Cranial Nerves: No cranial nerve deficit.     Deep Tendon Reflexes: Reflexes normal.  Psychiatric:        Mood and Affect: Mood and affect normal.     BP 116/70   Pulse 64   Ht 5\' 6"  (1.676 m)   Wt 199 lb (90.3 kg)   SpO2 96%   BMI 32.12 kg/m  Wt Readings from Last 3 Encounters:  05/04/20 199 lb (90.3  kg)  11/13/19 200 lb 8 oz (90.9 kg)  11/05/19 202 lb 4.8 oz (91.8 kg)     Health Maintenance Due  Topic Date Due  . Hepatitis C Screening  Never done    There are no preventive care reminders to display for this patient.  Lab Results  Component Value Date   TSH 2.10 03/25/2019   Lab Results  Component Value Date   WBC 7.9 03/25/2019   HGB 15.3 03/25/2019   HCT 45.0 03/25/2019   MCV 87.7 03/25/2019   PLT 232.0 03/25/2019   Lab Results  Component Value Date   NA 139 03/25/2019   K 4.3 03/25/2019   CO2 24 03/25/2019   GLUCOSE 89 03/25/2019   BUN 27 (H) 03/25/2019   CREATININE 1.09 03/25/2019   BILITOT 0.4 03/25/2019   ALKPHOS 72 03/25/2019   AST 15 03/25/2019   ALT 25 03/25/2019   PROT 6.8 03/25/2019   ALBUMIN 4.6 03/25/2019   CALCIUM 9.3 03/25/2019   GFR 70.88 03/25/2019   Lab Results  Component Value Date   CHOL 232 (H) 03/25/2019   Lab Results  Component Value Date   HDL 34.60 (L) 03/25/2019   Lab Results  Component Value Date   LDLCALC 87 01/25/2017   Lab Results  Component Value Date   TRIG (H) 03/25/2019    440.0 Triglyceride is over 400; calculations on Lipids are invalid.   Lab Results  Component Value Date   CHOLHDL 7 03/25/2019   Lab Results  Component Value Date   HGBA1C 5.8 03/25/2019      Assessment & Plan:   Problem List Items Addressed This Visit   None   Visit Diagnoses    Physical exam    -  Primary   Relevant Orders   Basic metabolic panel   Lipid panel   CBC with Differential/Platelet   TSH   Hepatic function panel   Hep C Antibody    -Refilled his  regular medications for 1 year -We discussed Shingrix vaccine and he will check with insurance coverage and consider getting this after the new year -Obtain screening labs including hepatitis C antibody -Reminder that he needs repeat colonoscopy this spring  Meds ordered this encounter  Medications  . benazepril (LOTENSIN) 20 MG tablet    Sig: Take 1 tablet (20 mg total) by mouth daily.    Dispense:  90 tablet    Refill:  3  . atorvastatin (LIPITOR) 10 MG tablet    Sig: Take one tablet by mouth once daily    Dispense:  90 tablet    Refill:  3    Follow-up: No follow-ups on file.    Carolann Littler, MD

## 2020-05-04 NOTE — Patient Instructions (Signed)
Remember repeat colonoscopy this Spring- around March.

## 2020-05-05 LAB — CBC WITH DIFFERENTIAL/PLATELET
Absolute Monocytes: 580 cells/uL (ref 200–950)
Basophils Absolute: 28 cells/uL (ref 0–200)
Basophils Relative: 0.4 %
Eosinophils Absolute: 83 cells/uL (ref 15–500)
Eosinophils Relative: 1.2 %
HCT: 46.6 % (ref 38.5–50.0)
Hemoglobin: 16.1 g/dL (ref 13.2–17.1)
Lymphs Abs: 2443 cells/uL (ref 850–3900)
MCH: 30 pg (ref 27.0–33.0)
MCHC: 34.5 g/dL (ref 32.0–36.0)
MCV: 86.8 fL (ref 80.0–100.0)
MPV: 10.9 fL (ref 7.5–12.5)
Monocytes Relative: 8.4 %
Neutro Abs: 3767 cells/uL (ref 1500–7800)
Neutrophils Relative %: 54.6 %
Platelets: 293 10*3/uL (ref 140–400)
RBC: 5.37 10*6/uL (ref 4.20–5.80)
RDW: 12.7 % (ref 11.0–15.0)
Total Lymphocyte: 35.4 %
WBC: 6.9 10*3/uL (ref 3.8–10.8)

## 2020-05-05 LAB — HEPATIC FUNCTION PANEL
AG Ratio: 1.9 (calc) (ref 1.0–2.5)
ALT: 25 U/L (ref 9–46)
AST: 14 U/L (ref 10–35)
Albumin: 4.6 g/dL (ref 3.6–5.1)
Alkaline phosphatase (APISO): 71 U/L (ref 35–144)
Bilirubin, Direct: 0.1 mg/dL (ref 0.0–0.2)
Globulin: 2.4 g/dL (calc) (ref 1.9–3.7)
Indirect Bilirubin: 0.5 mg/dL (calc) (ref 0.2–1.2)
Total Bilirubin: 0.6 mg/dL (ref 0.2–1.2)
Total Protein: 7 g/dL (ref 6.1–8.1)

## 2020-05-05 LAB — BASIC METABOLIC PANEL
BUN: 18 mg/dL (ref 7–25)
CO2: 25 mmol/L (ref 20–32)
Calcium: 9.9 mg/dL (ref 8.6–10.3)
Chloride: 106 mmol/L (ref 98–110)
Creat: 1.14 mg/dL (ref 0.70–1.33)
Glucose, Bld: 101 mg/dL — ABNORMAL HIGH (ref 65–99)
Potassium: 4.8 mmol/L (ref 3.5–5.3)
Sodium: 140 mmol/L (ref 135–146)

## 2020-05-05 LAB — LIPID PANEL
Cholesterol: 175 mg/dL (ref <200)
HDL: 35 mg/dL — ABNORMAL LOW (ref >=40)
LDL Cholesterol (Calc): 101 mg/dL (calc) — ABNORMAL HIGH
Non-HDL Cholesterol (Calc): 140 mg/dL (calc) — ABNORMAL HIGH (ref <130)
Total CHOL/HDL Ratio: 5 (calc) — ABNORMAL HIGH (ref <5.0)
Triglycerides: 276 mg/dL — ABNORMAL HIGH (ref <150)

## 2020-05-05 LAB — TSH: TSH: 2.03 mIU/L (ref 0.40–4.50)

## 2020-05-05 LAB — HEPATITIS C ANTIBODY
Hepatitis C Ab: NONREACTIVE
SIGNAL TO CUT-OFF: 0.02 (ref <1.00)

## 2020-05-22 ENCOUNTER — Other Ambulatory Visit: Payer: Self-pay

## 2020-05-25 ENCOUNTER — Ambulatory Visit: Payer: 59 | Admitting: Family Medicine

## 2020-05-25 ENCOUNTER — Encounter: Payer: Self-pay | Admitting: Family Medicine

## 2020-05-25 ENCOUNTER — Other Ambulatory Visit: Payer: Self-pay

## 2020-05-25 VITALS — BP 138/90 | HR 106 | Ht 66.0 in | Wt 201.0 lb

## 2020-05-25 DIAGNOSIS — Z113 Encounter for screening for infections with a predominantly sexual mode of transmission: Secondary | ICD-10-CM

## 2020-05-25 DIAGNOSIS — Z23 Encounter for immunization: Secondary | ICD-10-CM

## 2020-05-25 NOTE — Patient Instructions (Signed)
Preventing Sexually Transmitted Infections, Adult Sexually transmitted infections (STIs) are diseases that are spread from person to person (are contagious). They are spread, or transmitted, through bodily fluids exchanged during sex or sexual contact. These bodily fluids include saliva, semen, blood, vaginal mucus, and urine. STIs are very common among people of all ages. Some common STIs include:  Herpes.  Hepatitis B.  Chlamydia.  Gonorrhea.  Syphilis.  HPV (human papillomavirus).  HIV, also called the human immunodeficiency virus. This is the virus that can cause AIDS (acquired immunodeficiency syndrome). Often, people who have these STIs do not have symptoms. Even without symptoms, these infections can be spread from person to person and require treatment. How can these conditions affect me? STIs can be treated, and many STIs can be cured. However, some STIs cannot be cured and will affect you for the rest of your life. Certain STIs may:  Require you to take medicine for the rest of your life.  Affect your ability to have children (your fertility).  Increase your risk for developing another STI or certain serious health conditions. These may include: ? Cervical cancer. ? Head and neck cancer. ? Pelvic inflammatory disease (PID), in women. ? Organ damage or damage to other parts of your body, if the infection spreads.  Cause problems during pregnancy and may be transmitted to the baby during the pregnancy or childbirth. What can increase my risk? You may have an increased risk for developing an STI if:  You have unprotected sex. Sex includes oral, vaginal, or anal sex.  You have more than one sex partner.  You have a sex partner who has multiple sex partners.  You have sex with someone who has an STI.  You have an STI or you had an STI before.  You inject drugs or have a sex partner or partners who inject drugs. What actions can I take to prevent STIs? The only way  to completely prevent STIs is not to have sex of any kind. This is called practicing abstinence. If you are sexually active, you can protect yourself and others by taking these actions to lower your risk of getting an STI: Lifestyle Avoid mixing alcohol, drugs, and sex. Alcohol and drug use can affect your ability to make good decisions and can lead to risky sexual behaviors. Medicines Ask your health care provider about taking pre-exposure prophylaxis (PrEP) to prevent HIV infection. General information  Stay up to date on vaccinations. Certain vaccines can lower your risk of getting certain STIs, such as: ? Hepatitis A and hepatitis B vaccines. You may have been vaccinated as a young child, but you will likely need a booster shot as a teen or young adult. ? HPV (human papillomavirus) vaccine.  Have only one sex partner (be monogamous) or limit the number of sex partners you have.  Use methods that prevent the exchange of body fluids between partners (barrier protection) correctly every time you have sex. Barrier protection can be used during oral, vaginal, or anal sex. Commonly used barrier methods include: ? Male condom. ? Male condom. ? Dental dam.  Use a new condom for every sex act from start to finish.  Get tested for STIs. Have your partners get tested, too.  If you test positive for an STI, follow recommendations from your health care provider about treatment and make sure your sex partners are tested and treated.  Birth control pills, injections, implants, and intrauterine devices (IUDs) do not protect against STIs. To prevent both STIs and  pregnancy, always use a condom with another form of birth control.  Some STIs, such as herpes, are spread through skin-to-skin contact. A condom may not protect you from getting such STIs. Avoid all sexual contact if you or your partners have herpes and there is an active flare with open sores.   Where to find more information Learn more  about STIs from:  Centers for Disease Control and Prevention: ? More information about specific STIs: cdc.gov/std ? Places to get sexual health counseling and treatment for free or at a low cost: gettested.cdc.gov  U.S. Department of Health and Human Services: www.womenshealth.gov Summary  Sexually transmitted infections (STIs) can spread through exchanging bodily fluids during sexual contact. Fluids include saliva, semen, blood, vaginal mucus, and urine.  You may have an increased risk for developing an STI if you have unprotected sex. Sex includes oral, vaginal, or anal sex.  If you do have sex, limit your number of sex partners and use barrier protection every time you have sex. This information is not intended to replace advice given to you by your health care provider. Make sure you discuss any questions you have with your health care provider. Document Revised: 06/17/2019 Document Reviewed: 06/17/2019 Elsevier Patient Education  2021 Elsevier Inc.  

## 2020-05-25 NOTE — Progress Notes (Signed)
Established Patient Office Visit  Subjective:  Patient ID: Corey [name], male    DOB: Dec 01, 1965  Age: 55 y.o. MRN: [id number]  CC:  Chief Complaint  Patient presents with  . Rectal Problems    HPI Corey [name] presents for the following items.  He would like to go ahead and get his first Shingrix vaccine.  He is aware of potential side effects with the vaccine.  He is also aware that he needs second Shingrix in 2 to 6 months  Patient relates that he was seen at [facility] couple months ago and diagnosed with STI (gonorrhea).  Chlamydia was negative.  He was treated.  He states HIV was negative but he never did see that RPR was done.  He states he had the same partner for 17 years and they almost never have any sexual activity.  He did have 1 partner outside of this relationship which is right before he was diagnosed with a gonorrhea.  He had full cultures that time including oral and anal.  He has not had any sexual encounters whatsoever since then.  He recently noted a small bump around the perianal region and was concerned whether this could be a syphilitic chancre.  Bump was nonpainful.  No other rash.  No recent fevers or chills.  No dysuria.  No prior history of syphilis.  Past Medical History:  Diagnosis Date  . DEPRESSION 09/08/2009  . GERD 09/08/2009  . HYPERLIPIDEMIA 09/08/2009  . HYPERTENSION 09/08/2009  . Kidney stones   . NEPHROLITHIASIS, HX OF 09/08/2009  . PREDIABETES 09/08/2009    Past Surgical History:  Procedure Laterality Date  . COLONOSCOPY  07/2017  . lithrotripsy    . stent before lithrotripsy    . WISDOM TOOTH EXTRACTION     19 years    Family History  Problem Relation Age of Onset  . Diabetes Father   . Colon polyps Father   . Heart disease Father 30       CAD  . Colon polyps Mother   . Stroke Mother   . Dementia Mother   . Hyperlipidemia Neg Hx        family hx  . Colon cancer Neg Hx   . Esophageal cancer Neg Hx   . Rectal cancer Neg Hx    . Stomach cancer Neg Hx     Social History   Socioeconomic History  . Marital status: Significant Other    Spouse name: Not on file  . Number of children: Not on file  . Years of education: Not on file  . Highest education level: Not on file  Occupational History  . Not on file  Tobacco Use  . Smoking status: Never Smoker  . Smokeless tobacco: Never Used  Vaping Use  . Vaping Use: Never used  Substance and Sexual Activity  . Alcohol use: Yes    Comment: 2-3 wine and liquor a week  . Drug use: No  . Sexual activity: Not on file  Other Topics Concern  . Not on file  Social History Narrative  . Not on file   Social Determinants of Health   Financial Resource Strain: Not on file  Food Insecurity: Not on file  Transportation Needs: Not on file  Physical Activity: Not on file  Stress: Not on file  Social Connections: Not on file  Intimate Partner Violence: Not on file    Outpatient Medications Prior to Visit  Medication Sig Dispense Refill  .  atorvastatin (LIPITOR) 10 MG tablet Take one tablet by mouth once daily 90 tablet 3  . benazepril (LOTENSIN) 20 MG tablet Take 1 tablet (20 mg total) by mouth daily. 90 tablet 3  . Multiple Vitamin (MULTIVITAMIN WITH MINERALS) TABS Take 1 tablet by mouth daily.    . potassium citrate (UROCIT-K) 10 MEQ (1080 MG) SR tablet Take 10 mEq by mouth 2 (two) times daily.     Facility-Administered Medications Prior to Visit  Medication Dose Route Frequency Provider Last Rate Last Admin  . 0.9 %  sodium chloride infusion  500 mL Intravenous Once Armbruster, Carlota Raspberry, MD        Allergies  Allergen Reactions  . Percocet [Oxycodone-Acetaminophen]     Messes with his mind per pt    ROS Review of Systems  Constitutional: Negative for chills and fever.  Gastrointestinal: Negative for abdominal pain, nausea and vomiting.  Genitourinary: Negative for dysuria, hematuria, penile discharge, penile pain and scrotal swelling.  Skin: Negative  for rash.  Hematological: Negative for adenopathy.      Objective:    Physical Exam Vitals reviewed.  Constitutional:      Appearance: Normal appearance.  Cardiovascular:     Rate and Rhythm: Normal rate and regular rhythm.  Pulmonary:     Effort: Pulmonary effort is normal.     Breath sounds: Normal breath sounds.  Genitourinary:    Comments: He has fairly large skin tag around the 3 o'clock position.  Just inferior to this he has a small hemorrhoid which is nonthrombosed.  No other concerning lesions noted. Neurological:     Mental Status: He is alert.     BP 138/90   Pulse (!) 106   Ht 5\' 6"  (1.676 m)   Wt 201 lb (91.2 kg)   SpO2 98%   BMI 32.44 kg/m  Wt Readings from Last 3 Encounters:  05/25/20 201 lb (91.2 kg)  05/04/20 199 lb (90.3 kg)  11/13/19 200 lb 8 oz (90.9 kg)     There are no preventive care reminders to display for this patient.  There are no preventive care reminders to display for this patient.  Lab Results  Component Value Date   TSH 2.03 05/04/2020   Lab Results  Component Value Date   WBC 6.9 05/04/2020   HGB 16.1 05/04/2020   HCT 46.6 05/04/2020   MCV 86.8 05/04/2020   PLT 293 05/04/2020   Lab Results  Component Value Date   NA 140 05/04/2020   K 4.8 05/04/2020   CO2 25 05/04/2020   GLUCOSE 101 (H) 05/04/2020   BUN 18 05/04/2020   CREATININE 1.14 05/04/2020   BILITOT 0.6 05/04/2020   ALKPHOS 72 03/25/2019   AST 14 05/04/2020   ALT 25 05/04/2020   PROT 7.0 05/04/2020   ALBUMIN 4.6 03/25/2019   CALCIUM 9.9 05/04/2020   GFR 70.88 03/25/2019   Lab Results  Component Value Date   CHOL 175 05/04/2020   Lab Results  Component Value Date   HDL 35 (L) 05/04/2020   Lab Results  Component Value Date   LDLCALC 101 (H) 05/04/2020   Lab Results  Component Value Date   TRIG 276 (H) 05/04/2020   Lab Results  Component Value Date   CHOLHDL 5.0 (H) 05/04/2020   Lab Results  Component Value Date   HGBA1C 5.8 03/25/2019       Assessment & Plan:   Problem List Items Addressed This Visit   None   Visit Diagnoses  Screen for STD (sexually transmitted disease)    -  Primary   Relevant Orders   HIV antibody (with reflex)   RPR   Need for shingles vaccine        Patient had recent treatment for gonorrhea and states that testing, as far as he can see, for syphilis was not done.  We did not see any evidence for primary chancre on exam today. -Check HIV and RPR -Discussed safe sex practices with handout given -We did discuss possible preexposure prophylaxis but again he has had the same partner for 17 years except for 1 recent encounter and he does not plan to be sexually active whatsoever at this point.  He also knows that he can discuss this with [place].  No orders of the defined types were placed in this encounter.   Follow-up: No follow-ups on file.    Carolann Littler, MD

## 2020-05-26 LAB — RPR: RPR Ser Ql: REACTIVE — AB

## 2020-05-26 LAB — RPR TITER: RPR Titer: 1:1 {titer} — ABNORMAL HIGH

## 2020-05-26 LAB — FLUORESCENT TREPONEMAL AB(FTA)-IGG-BLD: Fluorescent Treponemal ABS: NONREACTIVE

## 2020-05-26 LAB — HIV ANTIBODY (ROUTINE TESTING W REFLEX): HIV 1&2 Ab, 4th Generation: NONREACTIVE

## 2020-05-27 ENCOUNTER — Encounter: Payer: Self-pay | Admitting: Family Medicine

## 2020-05-27 ENCOUNTER — Telehealth (INDEPENDENT_AMBULATORY_CARE_PROVIDER_SITE_OTHER): Payer: 59 | Admitting: Family Medicine

## 2020-05-27 VITALS — Ht 66.0 in | Wt 201.0 lb

## 2020-05-27 DIAGNOSIS — R768 Other specified abnormal immunological findings in serum: Secondary | ICD-10-CM

## 2020-05-27 NOTE — Progress Notes (Signed)
Patient ID: Corey [name], male   DOB: 02-Jul-1965, 55 y.o.   MRN: [id number]  This visit type was conducted due to national recommendations for restrictions regarding the COVID-19 pandemic in an effort to limit this patient's exposure and mitigate transmission in our community.   Virtual Visit via Video Note  I connected with Billey Gosling on 05/27/20 at  4:30 PM EST by a video enabled telemedicine application and verified that I am speaking with the correct person using two identifiers.  Location patient: home Location provider:work or home office Persons participating in the virtual visit: patient, provider  I discussed the limitations of evaluation and management by telemedicine and the availability of in person appointments. The patient expressed understanding and agreed to proceed.   HPI: [facility] called to discuss lab results.  His HIV screen came back negative.  Refer to previous note for details.  He had encounter recently with new partner (several weeks ago).  He had already had recent screen with GC and chlamydia which were negative.  We obtained RPR which came back reactive with one-to-one titer.  Reflex testing to treponemal antibody was nonreactive.  We did not see any evidence to suggest recent primary chancre on exam.  Patient is asymptomatic at this time.   ROS: See pertinent positives and negatives per HPI.  Past Medical History:  Diagnosis Date  . DEPRESSION 09/08/2009  . GERD 09/08/2009  . HYPERLIPIDEMIA 09/08/2009  . HYPERTENSION 09/08/2009  . Kidney stones   . NEPHROLITHIASIS, HX OF 09/08/2009  . PREDIABETES 09/08/2009    Past Surgical History:  Procedure Laterality Date  . COLONOSCOPY  07/2017  . lithrotripsy    . stent before lithrotripsy    . WISDOM TOOTH EXTRACTION     19 years    Family History  Problem Relation Age of Onset  . Diabetes Father   . Colon polyps Father   . Heart disease Father 63       CAD  . Colon polyps Mother   . Stroke Mother   . Dementia  Mother   . Hyperlipidemia Neg Hx        family hx  . Colon cancer Neg Hx   . Esophageal cancer Neg Hx   . Rectal cancer Neg Hx   . Stomach cancer Neg Hx     SOCIAL HX: Non-smoker   Current Outpatient Medications:  .  atorvastatin (LIPITOR) 10 MG tablet, Take one tablet by mouth once daily, Disp: 90 tablet, Rfl: 3 .  benazepril (LOTENSIN) 20 MG tablet, Take 1 tablet (20 mg total) by mouth daily., Disp: 90 tablet, Rfl: 3 .  Multiple Vitamin (MULTIVITAMIN WITH MINERALS) TABS, Take 1 tablet by mouth daily., Disp: , Rfl:  .  potassium citrate (UROCIT-K) 10 MEQ (1080 MG) SR tablet, Take 10 mEq by mouth 2 (two) times daily., Disp: , Rfl:   Current Facility-Administered Medications:  .  0.9 %  sodium chloride infusion, 500 mL, Intravenous, Once, Armbruster, Carlota Raspberry, MD  EXAM:  VITALS per patient if applicable:  GENERAL: alert, oriented, appears well and in no acute distress  HEENT: atraumatic, conjunttiva clear, no obvious abnormalities on inspection of external nose and ears  NECK: normal movements of the head and neck  LUNGS: on inspection no signs of respiratory distress, breathing rate appears normal, no obvious gross SOB, gasping or wheezing  CV: no obvious cyanosis  MS: moves all visible extremities without noticeable abnormality  PSYCH/NEURO: pleasant and cooperative, no obvious depression or anxiety, speech and  thought processing grossly intact  ASSESSMENT AND PLAN:  Discussed the following assessment and plan:  Reactive RPR with nonreactive treponemal antibody.  We explained that RPR screening tests (eg RPR and VDRL) are very nonspecific.  The more specific treponemal antibody came back nonreactive.  The only consideration would be whether to repeat treponemal antibody in 2-4 weeks.     I discussed the assessment and treatment plan with the patient. The patient was provided an opportunity to ask questions and all were answered. The patient agreed with the plan and  demonstrated an understanding of the instructions.   The patient was advised to call back or seek an in-person evaluation if the symptoms worsen or if the condition fails to improve as anticipated.     Carolann Littler, MD

## 2020-10-02 ENCOUNTER — Other Ambulatory Visit: Payer: Self-pay

## 2020-10-05 ENCOUNTER — Ambulatory Visit (INDEPENDENT_AMBULATORY_CARE_PROVIDER_SITE_OTHER): Payer: 59

## 2020-10-05 ENCOUNTER — Ambulatory Visit: Payer: 59 | Admitting: Family Medicine

## 2020-10-05 ENCOUNTER — Other Ambulatory Visit: Payer: Self-pay

## 2020-10-05 VITALS — BP 138/90 | Ht 66.0 in | Wt 201.0 lb

## 2020-10-05 DIAGNOSIS — Z23 Encounter for immunization: Secondary | ICD-10-CM | POA: Diagnosis not present

## 2020-11-09 NOTE — Progress Notes (Signed)
Agree with injection, as given.  Corey Post MD [facility] at [facility]

## 2020-12-29 ENCOUNTER — Encounter: Payer: Self-pay | Admitting: Gastroenterology

## 2021-04-14 ENCOUNTER — Ambulatory Visit: Payer: 59 | Admitting: Family Medicine

## 2021-04-25 ENCOUNTER — Other Ambulatory Visit: Payer: Self-pay | Admitting: Family Medicine

## 2021-05-19 ENCOUNTER — Ambulatory Visit (INDEPENDENT_AMBULATORY_CARE_PROVIDER_SITE_OTHER): Payer: Managed Care, Other (non HMO) | Admitting: Family Medicine

## 2021-05-19 ENCOUNTER — Encounter: Payer: Self-pay | Admitting: Gastroenterology

## 2021-05-19 VITALS — BP 122/78 | HR 58 | Temp 98.3°F | Ht 69.0 in | Wt 209.9 lb

## 2021-05-19 DIAGNOSIS — E785 Hyperlipidemia, unspecified: Secondary | ICD-10-CM

## 2021-05-19 DIAGNOSIS — Z8249 Family history of ischemic heart disease and other diseases of the circulatory system: Secondary | ICD-10-CM | POA: Diagnosis not present

## 2021-05-19 DIAGNOSIS — Z0000 Encounter for general adult medical examination without abnormal findings: Secondary | ICD-10-CM | POA: Diagnosis not present

## 2021-05-19 DIAGNOSIS — Z23 Encounter for immunization: Secondary | ICD-10-CM

## 2021-05-19 LAB — CBC WITH DIFFERENTIAL/PLATELET
Basophils Absolute: 0 10*3/uL (ref 0.0–0.1)
Basophils Relative: 0.5 % (ref 0.0–3.0)
Eosinophils Absolute: 0.2 10*3/uL (ref 0.0–0.7)
Eosinophils Relative: 2.5 % (ref 0.0–5.0)
HCT: 46.9 % (ref 39.0–52.0)
Hemoglobin: 15.7 g/dL (ref 13.0–17.0)
Lymphocytes Relative: 35.4 % (ref 12.0–46.0)
Lymphs Abs: 2.4 10*3/uL (ref 0.7–4.0)
MCHC: 33.6 g/dL (ref 30.0–36.0)
MCV: 86.4 fl (ref 78.0–100.0)
Monocytes Absolute: 0.8 10*3/uL (ref 0.1–1.0)
Monocytes Relative: 11.2 % (ref 3.0–12.0)
Neutro Abs: 3.4 10*3/uL (ref 1.4–7.7)
Neutrophils Relative %: 50.4 % (ref 43.0–77.0)
Platelets: 251 10*3/uL (ref 150.0–400.0)
RBC: 5.42 Mil/uL (ref 4.22–5.81)
RDW: 13.3 % (ref 11.5–15.5)
WBC: 6.7 10*3/uL (ref 4.0–10.5)

## 2021-05-19 LAB — HEPATIC FUNCTION PANEL
ALT: 30 U/L (ref 0–53)
AST: 18 U/L (ref 0–37)
Albumin: 4.4 g/dL (ref 3.5–5.2)
Alkaline Phosphatase: 77 U/L (ref 39–117)
Bilirubin, Direct: 0.1 mg/dL (ref 0.0–0.3)
Total Bilirubin: 0.8 mg/dL (ref 0.2–1.2)
Total Protein: 6.9 g/dL (ref 6.0–8.3)

## 2021-05-19 LAB — LIPID PANEL
Cholesterol: 180 mg/dL (ref 0–200)
HDL: 31.5 mg/dL — ABNORMAL LOW (ref >39.00)
NonHDL: 148.61
Total CHOL/HDL Ratio: 6
Triglycerides: 273 mg/dL — ABNORMAL HIGH (ref 0.0–149.0)
VLDL: 54.6 mg/dL — ABNORMAL HIGH (ref 0.0–40.0)

## 2021-05-19 LAB — PSA: PSA: 2.17 ng/mL (ref 0.10–4.00)

## 2021-05-19 LAB — LDL CHOLESTEROL, DIRECT: Direct LDL: 110 mg/dL

## 2021-05-19 LAB — BASIC METABOLIC PANEL
BUN: 19 mg/dL (ref 6–23)
CO2: 25 mEq/L (ref 19–32)
Calcium: 9.3 mg/dL (ref 8.4–10.5)
Chloride: 103 mEq/L (ref 96–112)
Creatinine, Ser: 1.15 mg/dL (ref 0.40–1.50)
GFR: 72.03 mL/min (ref >60.00)
Glucose, Bld: 108 mg/dL — ABNORMAL HIGH (ref 70–99)
Potassium: 4.4 mEq/L (ref 3.5–5.1)
Sodium: 138 mEq/L (ref 135–145)

## 2021-05-19 LAB — TSH: TSH: 1.92 u[IU]/mL (ref 0.35–5.50)

## 2021-05-19 LAB — HEMOGLOBIN A1C: Hgb A1c MFr Bld: 6 % (ref 4.6–6.5)

## 2021-05-19 NOTE — Patient Instructions (Signed)
Set up repeat colonoscopy.  I am setting up coronary calcium scan.

## 2021-05-19 NOTE — Progress Notes (Signed)
Established Patient Office Visit  Subjective:  Patient ID: Corey [name], male    DOB: [birth year]  Age: 56 y.o. MRN: [id number]  CC: No chief complaint on file.   HPI Corey [name] presents for physical exam.  He has history of kidney stones, hypertension, hyperlipidemia.  Generally doing well.  He has had some weight gain this past year and not exercising any.  He hopes to make some changes soon.  He is due for repeat colonoscopy.  Also needs tetanus booster.  Did have flu vaccine in September.  Also has had previous shingles vaccine.  Family history reviewed.  His father had CAD in his 38s.  Father also had type 2 diabetes and he had some family members on his mom's side with type 2 diabetes as well.  His mother had history of multiple strokes.  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  Non-smoker.  Rare alcohol.  The 10-year ASCVD risk score (Arnett DK, et al., 2019) is: 6.5%   Values used to calculate the score:     Age: 89 years     Sex: Male     Is Non-Hispanic African American: No     Diabetic: No     Tobacco smoker: No     Systolic Blood Pressure: 832 mmHg     Is BP treated: Yes     HDL Cholesterol: 35 mg/dL     Total Cholesterol: 175 mg/dL   Past Medical History:  Diagnosis Date   DEPRESSION 09/08/2009   GERD 09/08/2009   HYPERLIPIDEMIA 09/08/2009   HYPERTENSION 09/08/2009   Kidney stones    NEPHROLITHIASIS, HX OF 09/08/2009   PREDIABETES 09/08/2009    Past Surgical History:  Procedure Laterality Date   COLONOSCOPY  07/2017   lithrotripsy     stent before lithrotripsy     WISDOM TOOTH EXTRACTION     34 years    Family History  Problem Relation Age of Onset   Diabetes Father    Colon polyps Father    Heart disease Father 59       CAD   Colon polyps Mother    Stroke Mother    Dementia Mother    Hyperlipidemia Neg Hx        family hx   Colon cancer Neg Hx    Esophageal cancer Neg Hx    Rectal  cancer Neg Hx    Stomach cancer Neg Hx     Social History   Socioeconomic History   Marital status: Significant Other    Spouse name: Not on file   Number of children: Not on file   Years of education: Not on file   Highest education level: Not on file  Occupational History   Not on file  Tobacco Use   Smoking status: Never   Smokeless tobacco: Never  Vaping Use   Vaping Use: Never used  Substance and Sexual Activity   Alcohol use: Yes    Comment: 2-3 wine and liquor a week   Drug use: No   Sexual activity: Not on file  Other Topics Concern   Not on file  Social History Narrative   Not on file   Social Determinants of Health   Financial Resource Strain: Not on file  Food Insecurity: Not on file  Transportation Needs: Not on file  Physical Activity: Not on file  Stress: Not on file  Social Connections: Not on file  Intimate Partner Violence: Not  on file    Outpatient Medications Prior to Visit  Medication Sig Dispense Refill   atorvastatin (LIPITOR) 10 MG tablet TAKE 1 TABLET BY MOUTH EVERY DAY 90 tablet 3   benazepril (LOTENSIN) 20 MG tablet TAKE 1 TABLET BY MOUTH EVERY DAY 90 tablet 3   Multiple Vitamin (MULTIVITAMIN WITH MINERALS) TABS Take 1 tablet by mouth daily.     potassium citrate (UROCIT-K) 10 MEQ (1080 MG) SR tablet Take 10 mEq by mouth 2 (two) times daily.     Facility-Administered Medications Prior to Visit  Medication Dose Route Frequency Provider Last Rate Last Admin   0.9 %  sodium chloride infusion  500 mL Intravenous Once Armbruster, Carlota Raspberry, MD        Allergies  Allergen Reactions   Percocet [Oxycodone-Acetaminophen]     Messes with his mind per pt    ROS Review of Systems  Constitutional:  Negative for activity change, appetite change, fatigue and fever.  HENT:  Negative for congestion, ear pain and trouble swallowing.   Eyes:  Negative for pain and visual disturbance.  Respiratory:  Negative for cough, shortness of breath and  wheezing.   Cardiovascular:  Negative for chest pain and palpitations.  Gastrointestinal:  Negative for abdominal distention, abdominal pain, blood in stool, constipation, diarrhea, nausea, rectal pain and vomiting.  Endocrine: Negative for polydipsia and polyuria.  Genitourinary:  Negative for dysuria, hematuria and testicular pain.  Musculoskeletal:  Negative for arthralgias and joint swelling.  Skin:  Negative for rash.  Neurological:  Negative for dizziness, syncope and headaches.  Hematological:  Negative for adenopathy.  Psychiatric/Behavioral:  Negative for confusion and dysphoric mood.      Objective:    Physical Exam Constitutional:      General: He is not in acute distress.    Appearance: He is well-developed.  HENT:     Head: Normocephalic and atraumatic.     Right Ear: External ear normal.     Left Ear: External ear normal.  Eyes:     Conjunctiva/sclera: Conjunctivae normal.     Pupils: Pupils are equal, round, and reactive to light.  Neck:     Thyroid: No thyromegaly.  Cardiovascular:     Rate and Rhythm: Normal rate and regular rhythm.     Heart sounds: Normal heart sounds. No murmur heard. Pulmonary:     Effort: No respiratory distress.     Breath sounds: No wheezing or rales.  Abdominal:     General: Bowel sounds are normal. There is no distension.     Palpations: Abdomen is soft. There is no mass.     Tenderness: There is no abdominal tenderness. There is no guarding or rebound.  Musculoskeletal:     Cervical back: Normal range of motion and neck supple.     Right lower leg: No edema.     Left lower leg: No edema.  Lymphadenopathy:     Cervical: No cervical adenopathy.  Skin:    Findings: No rash.  Neurological:     Mental Status: He is alert and oriented to person, place, and time.     Cranial Nerves: No cranial nerve deficit.    BP 122/78 (BP Location: Left Arm, Cuff Size: Normal)    Pulse (!) 58    Temp 98.3 F (36.8 C) (Oral)    Ht 5\' 9"  (1.753  m)    Wt 209 lb 14.4 oz (95.2 kg)    SpO2 97%    BMI 31.00 kg/m  Wt Readings  from Last 3 Encounters:  05/19/21 209 lb 14.4 oz (95.2 kg)  10/06/20 201 lb (91.2 kg)  05/27/20 201 lb (91.2 kg)     Health Maintenance Due  Topic Date Due   COLONOSCOPY (Pts 45-42yrs Insurance coverage will need to be confirmed)  07/24/2020   TETANUS/TDAP  10/27/2020   COVID-19 Vaccine (5 - Booster for Pfizer series) 01/29/2021    There are no preventive care reminders to display for this patient.  Lab Results  Component Value Date   TSH 2.03 05/04/2020   Lab Results  Component Value Date   WBC 6.9 05/04/2020   HGB 16.1 05/04/2020   HCT 46.6 05/04/2020   MCV 86.8 05/04/2020   PLT 293 05/04/2020   Lab Results  Component Value Date   NA 140 05/04/2020   K 4.8 05/04/2020   CO2 25 05/04/2020   GLUCOSE 101 (H) 05/04/2020   BUN 18 05/04/2020   CREATININE 1.14 05/04/2020   BILITOT 0.6 05/04/2020   ALKPHOS 72 03/25/2019   AST 14 05/04/2020   ALT 25 05/04/2020   PROT 7.0 05/04/2020   ALBUMIN 4.6 03/25/2019   CALCIUM 9.9 05/04/2020   GFR 70.88 03/25/2019   Lab Results  Component Value Date   CHOL 175 05/04/2020   Lab Results  Component Value Date   HDL 35 (L) 05/04/2020   Lab Results  Component Value Date   LDLCALC 101 (H) 05/04/2020   Lab Results  Component Value Date   TRIG 276 (H) 05/04/2020   Lab Results  Component Value Date   CHOLHDL 5.0 (H) 05/04/2020   Lab Results  Component Value Date   HGBA1C 5.8 03/25/2019      Assessment & Plan:   Problem List Items Addressed This Visit   None Visit Diagnoses     Physical exam    -  Primary   Relevant Orders   Basic metabolic panel   Lipid panel   CBC with Differential/Platelet   TSH   Hepatic function panel   PSA   Hemoglobin A1c   Need for tetanus booster       Relevant Orders   Tdap vaccine greater than or equal to 7yo IM   Family history of premature CAD       Relevant Orders   CT CARDIAC SCORING (SELF PAY  ONLY)   Dyslipidemia       Relevant Orders   CT CARDIAC SCORING (SELF PAY ONLY)     -Tetanus booster given -Obtain screening labs as above.  Include A1c with his strong family history of type 2 diabetes  -We did discuss possible coronary calcium scan to further risk stratify especially given his family history of heart disease and his risk factors of hypertension and dyslipidemia He does express interest in this will be scheduled  We have also strongly recommend he set up repeat colonoscopy.  He plans to do so within the next few weeks  No orders of the defined types were placed in this encounter.   Follow-up: No follow-ups on file.    Carolann Littler, MD

## 2021-05-21 ENCOUNTER — Encounter: Payer: Self-pay | Admitting: Family Medicine

## 2021-06-11 ENCOUNTER — Encounter: Payer: Self-pay | Admitting: Gastroenterology

## 2021-06-21 ENCOUNTER — Ambulatory Visit (INDEPENDENT_AMBULATORY_CARE_PROVIDER_SITE_OTHER)
Admission: RE | Admit: 2021-06-21 | Discharge: 2021-06-21 | Disposition: A | Discharge status: Home / Self Care | Payer: Self-pay | Source: Ambulatory Visit | Attending: Family Medicine | Admitting: Family Medicine

## 2021-06-21 ENCOUNTER — Other Ambulatory Visit: Payer: Self-pay

## 2021-06-21 DIAGNOSIS — E785 Hyperlipidemia, unspecified: Secondary | ICD-10-CM

## 2021-06-21 DIAGNOSIS — E7800 Pure hypercholesterolemia, unspecified: Secondary | ICD-10-CM

## 2021-06-21 DIAGNOSIS — Z8249 Family history of ischemic heart disease and other diseases of the circulatory system: Secondary | ICD-10-CM

## 2021-06-21 MED ORDER — ATORVASTATIN CALCIUM 40 MG PO TABS: 40.0000 mg | ORAL_TABLET | Freq: Every day | ORAL | 3 refills | Status: DC

## 2021-07-12 ENCOUNTER — Ambulatory Visit (AMBULATORY_SURGERY_CENTER): Payer: Managed Care, Other (non HMO)

## 2021-07-12 ENCOUNTER — Other Ambulatory Visit: Payer: Self-pay

## 2021-07-12 VITALS — Ht 69.0 in | Wt 202.0 lb

## 2021-07-12 DIAGNOSIS — Z86010 Personal history of colonic polyps: Secondary | ICD-10-CM

## 2021-07-12 MED ORDER — NA SULFATE-K SULFATE-MG SULF 17.5-3.13-1.6 GM/177ML PO SOLN
1.0000 | Freq: Once | ORAL | 0 refills | Status: AC
Start: 2021-07-12 — End: 2021-07-12

## 2021-07-12 NOTE — Progress Notes (Signed)
No egg or soy allergy known to patient  No issues known to pt with past sedation with any surgeries or procedures Patient denies ever being told they had issues or difficulty with intubation  No FH of Malignant Hyperthermia Pt is not on diet pills Pt is not on home 02  Pt is not on blood thinners  Pt denies issues with constipation;  No A fib or A flutter Pt is fully vaccinated for Covid;  NO PA's for preps discussed with pt in PV today  Discussed with pt there will be an out-of-pocket cost for prep and that varies from $0 to 70 + dollars - pt verbalized understanding  Due to the COVID-19 pandemic we are asking patients to follow certain guidelines in PV and the [facility]   Pt aware of COVID protocols and [facility] guidelines  PV completed over the phone. Pt verified name, DOB, address and insurance during PV today.  Pt mailed instruction packet with copy of consent form to read and not return, and instructions.  Pt encouraged to call with questions or issues.  If pt has My chart, procedure instructions sent via My Chart

## 2021-07-13 ENCOUNTER — Encounter: Payer: Managed Care, Other (non HMO) | Admitting: Gastroenterology

## 2021-07-19 ENCOUNTER — Encounter: Payer: Self-pay | Admitting: Gastroenterology

## 2021-07-26 ENCOUNTER — Ambulatory Visit (AMBULATORY_SURGERY_CENTER): Payer: Managed Care, Other (non HMO) | Admitting: Gastroenterology

## 2021-07-26 ENCOUNTER — Other Ambulatory Visit: Payer: Self-pay

## 2021-07-26 ENCOUNTER — Encounter: Payer: Self-pay | Admitting: Gastroenterology

## 2021-07-26 VITALS — BP 99/70 | HR 61 | Temp 97.8°F | Resp 11 | Ht 69.0 in | Wt 202.0 lb

## 2021-07-26 DIAGNOSIS — K6289 Other specified diseases of anus and rectum: Secondary | ICD-10-CM

## 2021-07-26 DIAGNOSIS — D120 Benign neoplasm of cecum: Secondary | ICD-10-CM | POA: Diagnosis not present

## 2021-07-26 DIAGNOSIS — Z86010 Personal history of colonic polyps: Secondary | ICD-10-CM | POA: Diagnosis present

## 2021-07-26 MED ORDER — SODIUM CHLORIDE 0.9 % IV SOLN: 500.0000 mL | INTRAVENOUS | Status: DC

## 2021-07-26 NOTE — Progress Notes (Signed)
Called to room to assist during endoscopic procedure.  Patient ID and intended procedure confirmed with present staff. Received instructions for my participation in the procedure from the performing physician.  

## 2021-07-26 NOTE — Progress Notes (Signed)
PT taken to PACU. Monitors in place. VSS. Report given to RN. 

## 2021-07-26 NOTE — Patient Instructions (Signed)
Impression/Recommendations: ? ?Polyp, diverticulosis, and hemorrhoid handouts given to patient. ? ?Resume previous diet. ?Continue present medications. ?Await pathology results. ? ?YOU HAD AN ENDOSCOPIC PROCEDURE TODAY AT [facility]:   Refer to the procedure report that was given to you for any specific questions about what was found during the examination.  If the procedure report does not answer your questions, please call your gastroenterologist to clarify.  If you requested that your care partner not be given the details of your procedure findings, then the procedure report has been included in a sealed envelope for you to review at your convenience later. ? ?YOU SHOULD EXPECT: Some feelings of bloating in the abdomen. Passage of more gas than usual.  Walking can help get rid of the air that was put into your GI tract during the procedure and reduce the bloating. If you had a lower endoscopy (such as a colonoscopy or flexible sigmoidoscopy) you may notice spotting of blood in your stool or on the toilet paper. If you underwent a bowel prep for your procedure, you may not have a normal bowel movement for a few days. ? ?Please Note:  You might notice some irritation and congestion in your nose or some drainage.  This is from the oxygen used during your procedure.  There is no need for concern and it should clear up in a day or so. ? ?SYMPTOMS TO REPORT IMMEDIATELY: ? ?Following lower endoscopy (colonoscopy or flexible sigmoidoscopy): ? Excessive amounts of blood in the stool ? Significant tenderness or worsening of abdominal pains ? Swelling of the abdomen that is new, acute ? Fever of 100?F or higher ? ?For urgent or emergent issues, a gastroenterologist can be reached at any hour by calling [phone]. ?Do not use MyChart messaging for urgent concerns.  ? ? ?DIET:  We do recommend a small meal at first, but then you may proceed to your regular diet.  Drink plenty of fluids but you should  avoid alcoholic beverages for 24 hours. ? ?ACTIVITY:  You should plan to take it easy for the rest of today and you should NOT DRIVE or use heavy machinery until tomorrow (because of the sedation medicines used during the test).   ? ?FOLLOW UP: ?Our staff will call the number listed on your records 48-72 hours following your procedure to check on you and address any questions or concerns that you may have regarding the information given to you following your procedure. If we do not reach you, we will leave a message.  We will attempt to reach you two times.  During this call, we will ask if you have developed any symptoms of COVID 19. If you develop any symptoms (ie: fever, flu-like symptoms, shortness of breath, cough etc.) before then, please call [phone].  If you test positive for Covid 19 in the 2 weeks post procedure, please call and report this information to [place].   ? ?If any biopsies were taken you will be contacted by phone or by letter within the next 1-3 weeks.  Please call us at [phone] if you have not heard about the biopsies in 3 weeks.  ? ? ?SIGNATURES/CONFIDENTIALITY: ?You and/or your care partner have signed paperwork which will be entered into your electronic medical record.  These signatures attest to the fact that that the information above on your After Visit Summary has been reviewed and is understood.  Full responsibility of the confidentiality of this discharge information lies with you and/or your  care-partner.  ?

## 2021-07-26 NOTE — Op Note (Signed)
[place] ?Patient Name: Corey [name] ?Procedure Date: 07/26/2021 12:01 PM ?MRN: [id number] ?Endoscopist: Carlota Raspberry. Havery Moros , MD ?Age: 56 ?Referring MD:  ?Date of Birth: [birth year] ?Gender: Male ?Account #: [id number] ?Procedure:                Colonoscopy ?Indications:              High risk colon cancer surveillance: Personal  ?                          history of colonic polyps (4 adenomas removed  ?                          07/2017) ?Medicines:                Monitored Anesthesia Care ?Procedure:                Pre-Anesthesia Assessment: ?                          - Prior to the procedure, a History and Physical  ?                          was performed, and patient medications and  ?                          allergies were reviewed. The patient's tolerance of  ?                          previous anesthesia was also reviewed. The risks  ?                          and benefits of the procedure and the sedation  ?                          options and risks were discussed with the patient.  ?                          All questions were answered, and informed consent  ?                          was obtained. Prior Anticoagulants: The patient has  ?                          taken no previous anticoagulant or antiplatelet  ?                          agents. ASA Grade Assessment: II - A patient with  ?                          mild systemic disease. After reviewing the risks  ?                          and benefits, the patient was deemed in  ?  satisfactory condition to undergo the procedure. ?                          After obtaining informed consent, the colonoscope  ?                          was passed under direct vision. Throughout the  ?                          procedure, the patient's blood pressure, pulse, and  ?                          oxygen saturations were monitored continuously. The  ?                          CF [id number] #[id number] was introduced through the anus  ?                           and advanced to the the cecum, identified by  ?                          appendiceal orifice and ileocecal valve. The  ?                          colonoscopy was performed without difficulty. The  ?                          patient tolerated the procedure well. The quality  ?                          of the bowel preparation was adequate. The  ?                          ileocecal valve, appendiceal orifice, and rectum  ?                          were photographed. ?Scope In: 12:14:30 PM ?Scope Out: 12:35:36 PM ?Scope Withdrawal Time: 0 hours 17 minutes 34 seconds  ?Total Procedure Duration: 0 hours 21 minutes 6 seconds  ?Findings:                 The perianal and digital rectal examinations were  ?                          normal. ?                          Two flat and sessile polyps were found in the  ?                          cecum. The polyps were 2 to 4 mm in size. These  ?                          polyps were removed with a cold snare. Resection  ?  and retrieval were complete. ?                          Multiple small-mouthed diverticula were found in  ?                          the left colon. ?                          Internal hemorrhoids were found during retroflexion. ?                          Anal papilla(e) was hypertrophied vs. squamous  ?                          papilloma of the dentate line. Biopsies were taken  ?                          with a cold forceps for histology to ensure no AIN  ?                          (which appeared to remove the lesion in entirety). ?                          The colon was long and redundant. The exam was  ?                          otherwise without abnormality. ?Complications:            No immediate complications. Estimated blood loss:  ?                          Minimal. ?Estimated Blood Loss:     Estimated blood loss was minimal. ?Impression:               - Two 2 to 4 mm polyps in the cecum, removed with a  ?                           cold snare. Resected and retrieved. ?                          - Diverticulosis in the left colon. ?                          - Internal hemorrhoids. ?                          - Anal papilla(e) was hypertrophied vs. benign  ?                          squamous papilloma. Biopsied / removed. ?                          - The examination was otherwise normal. ?Recommendation:           - Patient has a contact number available for  ?  emergencies. The signs and symptoms of potential  ?                          delayed complications were discussed with the  ?                          patient. Return to normal activities tomorrow.  ?                          Written discharge instructions were provided to the  ?                          patient. ?                          - Resume previous diet. ?                          - Continue present medications. ?                          - Await pathology results. ?Carlota Raspberry. Havery Moros, MD ?07/26/2021 12:40:20 PM ?This report has been signed electronically. ?

## 2021-07-26 NOTE — Progress Notes (Signed)
[facility] History and Physical ? ? ?Primary Care Physician:  Eulas Post, MD ? ? ?Reason for Procedure:   History of colon polyps ? ?Plan:    colonoscopy ? ? ? ? ?HPI: Corey [name] is a 56 y.o. male  here for colonoscopy surveillance - 4 adenomas removed 07/2017. Patient denies any bowel symptoms at this time.  Otherwise feels well without any cardiopulmonary symptoms.  ? ? ?Past Medical History:  ?Diagnosis Date  ? Anxiety   ? hx of  ? DEPRESSION 09/08/2009  ? hx of  ? GERD 09/08/2009  ? OTC PRN-with certain foods-family history of GERD  ? HYPERLIPIDEMIA 09/08/2009  ? on meds  ? HYPERTENSION 09/08/2009  ? on meds  ? Kidney stones   ? NEPHROLITHIASIS, HX OF 09/08/2009  ? PREDIABETES 09/08/2009  ? ? ?Past Surgical History:  ?Procedure Laterality Date  ? COLONOSCOPY  07/2017  ? SA-MAC-suprep (adeq)-tics/polyps  ? CYSTOSCOPY W/ URETERAL STENT PLACEMENT    ? LITHOTRIPSY    ? [place]  ? ? ?Prior to Admission medications   ?Medication Sig Start Date End Date Taking? Authorizing Provider  ?atorvastatin (LIPITOR) 40 MG tablet Take 1 tablet (40 mg total) by mouth daily. 06/21/21  Yes Burchette, Alinda Sierras, MD  ?benazepril (LOTENSIN) 20 MG tablet TAKE 1 TABLET BY MOUTH EVERY DAY 04/26/21  Yes Burchette, Alinda Sierras, MD  ?Multiple Vitamin (MULTIVITAMIN WITH MINERALS) TABS Take 1 tablet by mouth daily.   Yes [provider]  ?Omega-3 Fatty Acids (FISH OIL PO) Take 1 capsule by mouth daily at 6 (six) AM.   Yes [provider]  ?potassium citrate (UROCIT-K) 10 MEQ (1080 MG) SR tablet Take 10 mEq by mouth 2 (two) times daily. 01/27/20  Yes [provider]  ? ? ?Current Outpatient Medications  ?Medication Sig Dispense Refill  ? atorvastatin (LIPITOR) 40 MG tablet Take 1 tablet (40 mg total) by mouth daily. 90 tablet 3  ? benazepril (LOTENSIN) 20 MG tablet TAKE 1 TABLET BY MOUTH EVERY DAY 90 tablet 3  ? Multiple Vitamin (MULTIVITAMIN WITH MINERALS) TABS Take 1 tablet by  mouth daily.    ? Omega-3 Fatty Acids (FISH OIL PO) Take 1 capsule by mouth daily at 6 (six) AM.    ? potassium citrate (UROCIT-K) 10 MEQ (1080 MG) SR tablet Take 10 mEq by mouth 2 (two) times daily.    ? ?Current Facility-Administered Medications  ?Medication Dose Route Frequency Provider Last Rate Last Admin  ? 0.9 %  sodium chloride infusion  500 mL Intravenous Continuous Christabella Alvira, Carlota Raspberry, MD      ? ? ?Allergies as of 07/26/2021 - Review Complete 07/26/2021  ?Allergen Reaction Noted  ? Percocet [oxycodone-acetaminophen]  03/25/2019  ? ? ?Family History  ?Problem Relation Age of Onset  ? Colon polyps Mother 69  ? Stroke Mother   ? Dementia Mother   ? Diabetes Father   ? Colon polyps Father 83  ? Heart disease Father 41  ?     CAD  ? Hyperlipidemia Neg Hx   ?     family hx  ? Colon cancer Neg Hx   ? Esophageal cancer Neg Hx   ? Rectal cancer Neg Hx   ? Stomach cancer Neg Hx   ? ? ?Social History  ? ?Socioeconomic History  ? Marital status: Significant Other  ?  Spouse name: Not on file  ? Number of children: Not on file  ? Years of education: Not  on file  ? Highest education level: Not on file  ?Occupational History  ? Not on file  ?Tobacco Use  ? Smoking status: Never  ? Smokeless tobacco: Never  ?Vaping Use  ? Vaping Use: Never used  ?Substance and Sexual Activity  ? Alcohol use: Yes  ?  Alcohol/week: 1.0 standard drink  ?  Types: 1 Standard drinks or equivalent per week  ? Drug use: No  ? Sexual activity: Not on file  ?Other Topics Concern  ? Not on file  ?Social History Narrative  ? Not on file  ? ?Social Determinants of Health  ? ?Financial Resource Strain: Not on file  ?Food Insecurity: Not on file  ?Transportation Needs: Not on file  ?Physical Activity: Not on file  ?Stress: Not on file  ?Social Connections: Not on file  ?Intimate Partner Violence: Not on file  ? ? ?Review of Systems: ?All other review of systems negative except as mentioned in the HPI. ? ?Physical Exam: ?Vital signs ?BP 123/77   Pulse  67   Temp 97.8 ?F (36.6 ?C)   Ht _0  (1.753 m)   Wt 202 lb (91.6 kg)   SpO2 97%   BMI 29.83 kg/m?  ? ?General:   Alert,  Well-developed, pleasant and cooperative in NAD ?Lungs:  Clear throughout to auscultation.   ?Heart:  Regular rate and rhythm ?Abdomen:  Soft, nontender and nondistended.   ?Neuro/Psych:  Alert and cooperative. Normal mood and affect. A and O x 3 ? ?Jolly Mango, MD ?[facility] ? ? ?

## 2021-07-28 ENCOUNTER — Telehealth: Payer: Self-pay

## 2021-07-28 NOTE — Telephone Encounter (Signed)
?  Follow up Call- ? ?Call back number 07/26/2021  ?Post procedure Call Back phone  # [phone]  ?Permission to leave phone message Yes  ?Some recent data might be hidden  ?  ? ?Patient questions: ? ?Do you have a fever, pain , or abdominal swelling? No. ?Pain Score  0 * ? ?Have you tolerated food without any problems? Yes.   ? ?Have you been able to return to your normal activities? Yes.   ? ?Do you have any questions about your discharge instructions: ?Diet   No. ?Medications  No. ?Follow up visit  No. ? ?Do you have questions or concerns about your Care? No. ? ?Actions: ?* If pain score is 4 or above: ?No action needed, pain <4. ? ? ?Have you developed a fever since your procedure? no ? ?2.   Have you had an respiratory symptoms (SOB or cough) since your procedure? no ? ?3.   Have you tested positive for COVID 19 since your procedure no ? ?4.   Have you had any family members/close contacts diagnosed with the COVID 19 since your procedure?  no ? ? ?If yes to any of these questions please route to Joylene John, RN and Joella Prince, RN ? ? ? ?

## 2021-08-03 ENCOUNTER — Encounter: Payer: Self-pay | Admitting: Gastroenterology

## 2021-08-23 ENCOUNTER — Other Ambulatory Visit (INDEPENDENT_AMBULATORY_CARE_PROVIDER_SITE_OTHER): Payer: Managed Care, Other (non HMO)

## 2021-08-23 DIAGNOSIS — E7800 Pure hypercholesterolemia, unspecified: Secondary | ICD-10-CM

## 2021-08-23 LAB — LIPID PANEL
Cholesterol: 148 mg/dL (ref 0–200)
HDL: 34.2 mg/dL — ABNORMAL LOW (ref >39.00)
NonHDL: 113.99
Total CHOL/HDL Ratio: 4
Triglycerides: 295 mg/dL — ABNORMAL HIGH (ref 0.0–149.0)
VLDL: 59 mg/dL — ABNORMAL HIGH (ref 0.0–40.0)

## 2021-08-23 LAB — LDL CHOLESTEROL, DIRECT: Direct LDL: 80 mg/dL

## 2021-09-23 ENCOUNTER — Other Ambulatory Visit: Payer: Self-pay

## 2021-09-23 MED ORDER — BENAZEPRIL HCL 20 MG PO TABS: 20.0000 mg | ORAL_TABLET | Freq: Every day | ORAL | 1 refills | Status: DC

## 2021-10-13 ENCOUNTER — Other Ambulatory Visit: Payer: Self-pay

## 2021-10-13 MED ORDER — ATORVASTATIN CALCIUM 40 MG PO TABS
40.0000 mg | ORAL_TABLET | Freq: Every day | ORAL | 2 refills | Status: DC
Start: 2021-10-13 — End: 2022-06-30

## 2022-05-12 ENCOUNTER — Other Ambulatory Visit: Payer: Self-pay | Admitting: Family Medicine

## 2022-05-18 DIAGNOSIS — N202 Calculus of kidney with calculus of ureter: Secondary | ICD-10-CM | POA: Diagnosis not present

## 2022-06-08 DIAGNOSIS — N200 Calculus of kidney: Secondary | ICD-10-CM | POA: Diagnosis not present

## 2022-06-08 DIAGNOSIS — N400 Enlarged prostate without lower urinary tract symptoms: Secondary | ICD-10-CM | POA: Diagnosis not present

## 2022-06-09 ENCOUNTER — Other Ambulatory Visit: Payer: Self-pay | Admitting: Urology

## 2022-06-17 ENCOUNTER — Encounter: Payer: Self-pay | Admitting: Family Medicine

## 2022-06-17 ENCOUNTER — Ambulatory Visit (INDEPENDENT_AMBULATORY_CARE_PROVIDER_SITE_OTHER): Payer: 59 | Admitting: Family Medicine

## 2022-06-17 VITALS — BP 118/80 | HR 78 | Temp 98.9°F | Ht 69.29 in | Wt 199.5 lb

## 2022-06-17 DIAGNOSIS — R972 Elevated prostate specific antigen [PSA]: Secondary | ICD-10-CM | POA: Diagnosis not present

## 2022-06-17 DIAGNOSIS — Z0000 Encounter for general adult medical examination without abnormal findings: Secondary | ICD-10-CM

## 2022-06-17 LAB — CBC WITH DIFFERENTIAL/PLATELET
Basophils Absolute: 0 10*3/uL (ref 0.0–0.1)
Basophils Relative: 0.4 % (ref 0.0–3.0)
Eosinophils Absolute: 0.1 10*3/uL (ref 0.0–0.7)
Eosinophils Relative: 1.2 % (ref 0.0–5.0)
HCT: 45.5 % (ref 39.0–52.0)
Hemoglobin: 15.5 g/dL (ref 13.0–17.0)
Lymphocytes Relative: 30.4 % (ref 12.0–46.0)
Lymphs Abs: 2.5 10*3/uL (ref 0.7–4.0)
MCHC: 34.1 g/dL (ref 30.0–36.0)
MCV: 87.2 fl (ref 78.0–100.0)
Monocytes Absolute: 0.8 10*3/uL (ref 0.1–1.0)
Monocytes Relative: 9.3 % (ref 3.0–12.0)
Neutro Abs: 4.8 10*3/uL (ref 1.4–7.7)
Neutrophils Relative %: 58.7 % (ref 43.0–77.0)
Platelets: 247 10*3/uL (ref 150.0–400.0)
RBC: 5.22 Mil/uL (ref 4.22–5.81)
RDW: 13.3 % (ref 11.5–15.5)
WBC: 8.1 10*3/uL (ref 4.0–10.5)

## 2022-06-17 LAB — LIPID PANEL
Cholesterol: 162 mg/dL (ref 0–200)
HDL: 36.1 mg/dL — ABNORMAL LOW (ref >39.00)
LDL Cholesterol: 98 mg/dL (ref 0–99)
NonHDL: 126
Total CHOL/HDL Ratio: 4
Triglycerides: 139 mg/dL (ref 0.0–149.0)
VLDL: 27.8 mg/dL (ref 0.0–40.0)

## 2022-06-17 LAB — HEPATIC FUNCTION PANEL
ALT: 25 U/L (ref 0–53)
AST: 15 U/L (ref 0–37)
Albumin: 4.5 g/dL (ref 3.5–5.2)
Alkaline Phosphatase: 85 U/L (ref 39–117)
Bilirubin, Direct: 0.1 mg/dL (ref 0.0–0.3)
Total Bilirubin: 0.5 mg/dL (ref 0.2–1.2)
Total Protein: 7.1 g/dL (ref 6.0–8.3)

## 2022-06-17 LAB — BASIC METABOLIC PANEL
BUN: 22 mg/dL (ref 6–23)
CO2: 27 mEq/L (ref 19–32)
Calcium: 9.7 mg/dL (ref 8.4–10.5)
Chloride: 106 mEq/L (ref 96–112)
Creatinine, Ser: 1.16 mg/dL (ref 0.40–1.50)
GFR: 70.75 mL/min (ref >60.00)
Glucose, Bld: 98 mg/dL (ref 70–99)
Potassium: 5 mEq/L (ref 3.5–5.1)
Sodium: 141 mEq/L (ref 135–145)

## 2022-06-17 LAB — PSA: PSA: 3.53 ng/mL (ref 0.10–4.00)

## 2022-06-17 LAB — HEMOGLOBIN A1C: Hgb A1c MFr Bld: 6.1 % (ref 4.6–6.5)

## 2022-06-17 LAB — TSH: TSH: 1.93 u[IU]/mL (ref 0.35–5.50)

## 2022-06-17 NOTE — Progress Notes (Signed)
Established Patient Office Visit  Subjective   Patient ID: Corey [name], male    DOB: 1965-09-24  Age: 57 y.o. MRN: [id number]  No chief complaint on file.   HPI   Corey [name] is seen for physical exam.  He has history of hypertension, hyperlipidemia, history of recurrent kidney stones, prediabetes history of colon adenoma.  He is scheduled for lithotripsy later this month.  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  He travels up to [place] twice per week.  He has made some dietary changes and is lost about 10 pounds since last year.  Feels relatively well overall.  Positive family history of coronary disease in his father in his 39s.  [name] had coronary calcium score last year of 281 with LAD 229 and 93rd percentile overall.  We did increase his Lipitor to 40 mg daily and tolerating well.  Had some muscle fatigue initially.  No recent chest pains.  Occasional shortness of breath with hills but not consistently  Health maintenance reviewed:  -Vaccines up-to-date including influenza, shingles, tetanus -Colonoscopy due 2030 -Prior hepatitis C screen negative  Family history reviewed.  His father had CAD in his 21s.  Father also had type 2 diabetes and he had some family members on his mom's side with type 2 diabetes as well.  His mother had history of multiple strokes.  Both his parents are still alive and living in [place] history-professor of music and currently teaching at [employer] .  Non-smoker.  Rare alcohol.  Past Medical History:  Diagnosis Date   Anxiety    hx of   DEPRESSION 09/08/2009   hx of   GERD 09/08/2009   OTC PRN-with certain foods-family history of GERD   HYPERLIPIDEMIA 09/08/2009   on meds   HYPERTENSION 09/08/2009   on meds   Kidney stones    NEPHROLITHIASIS, HX OF 09/08/2009   PREDIABETES 09/08/2009   Past Surgical History:  Procedure Laterality Date   COLONOSCOPY  07/2017   SA-MAC-suprep (adeq)-tics/polyps   CYSTOSCOPY W/  URETERAL STENT PLACEMENT     LITHOTRIPSY     WISDOM TOOTH EXTRACTION  1987    reports that he has never smoked. He has never used smokeless tobacco. He reports current alcohol use of about 1.0 standard drink of alcohol per week. He reports that he does not use drugs. family history includes Colon polyps (age of onset: 10) in his father and mother; Dementia in his mother; Diabetes in his father; Heart disease (age of onset: 90) in his father; Stroke in his mother. Allergies  Allergen Reactions   Percocet [Oxycodone-Acetaminophen]     Messes with his mind per pt    Review of Systems  Constitutional:  Negative for chills, fever and malaise/fatigue.  HENT:  Negative for hearing loss.   Eyes:  Negative for blurred vision and double vision.  Respiratory:  Negative for cough and shortness of breath.   Cardiovascular:  Negative for chest pain, palpitations and leg swelling.  Gastrointestinal:  Negative for abdominal pain, blood in stool, constipation and diarrhea.  Genitourinary:  Negative for dysuria.  Skin:  Negative for rash.  Neurological:  Negative for dizziness, speech change, seizures, loss of consciousness and headaches.  Psychiatric/Behavioral:  Negative for depression.       Objective:     BP 118/80 (BP Location: Left Arm, Patient Position: Sitting, Cuff Size: Normal)   Pulse 78   Temp 98.9 F (37.2 C) (Oral)   Ht 5' 9.29" (1.76  m)   Wt 199 lb 8 oz (90.5 kg)   SpO2 97%   BMI 29.21 kg/m    Physical Exam Vitals reviewed.  Constitutional:      General: He is not in acute distress.    Appearance: He is well-developed.  HENT:     Head: Normocephalic and atraumatic.     Right Ear: External ear normal.     Left Ear: External ear normal.  Eyes:     Conjunctiva/sclera: Conjunctivae normal.     Pupils: Pupils are equal, round, and reactive to light.  Neck:     Thyroid: No thyromegaly.  Cardiovascular:     Rate and Rhythm: Normal rate and regular rhythm.     Heart  sounds: Normal heart sounds. No murmur heard. Pulmonary:     Effort: No respiratory distress.     Breath sounds: No wheezing or rales.  Abdominal:     General: Bowel sounds are normal. There is no distension.     Palpations: Abdomen is soft. There is no mass.     Tenderness: There is no abdominal tenderness. There is no guarding or rebound.  Musculoskeletal:     Cervical back: Normal range of motion and neck supple.  Lymphadenopathy:     Cervical: No cervical adenopathy.  Skin:    Findings: No rash.  Neurological:     Mental Status: He is alert and oriented to person, place, and time.     Cranial Nerves: No cranial nerve deficit.      No results found for any visits on 06/17/22.    The 10-year ASCVD risk score (Arnett DK, et al., 2019) is: 5.6%    Assessment & Plan:   Problem List Items Addressed This Visit   None Visit Diagnoses     Physical exam    -  Primary   Relevant Orders   Basic metabolic panel   Lipid panel   CBC with Differential/Platelet   Hepatic function panel   TSH   Hemoglobin A1c   PSA     Hypertension stable.  Hyperlipidemia history treated with atorvastatin.  Coronary calcium score last year of 281 with positive family history of CAD in his father.  -Recheck labs as above -Continue annual flu vaccine -Check A1c with his history of prediabetes range blood sugars. -Try to establish more consistent exercise  No follow-ups on file.    Carolann Littler, MD

## 2022-06-20 NOTE — Addendum Note (Signed)
Addended by: Nilda Riggs on: 06/20/2022 08:24 AM   Modules accepted: Orders

## 2022-06-28 NOTE — Progress Notes (Signed)
Left message to return call for instuctions

## 2022-06-29 ENCOUNTER — Encounter (HOSPITAL_BASED_OUTPATIENT_CLINIC_OR_DEPARTMENT_OTHER): Payer: Self-pay | Admitting: Urology

## 2022-06-29 NOTE — Progress Notes (Signed)
Pre-op phone call complete. Procedure date and arrival time confirmed. Patient allergies, medications, and medical history verified. Patient advised to stop vitamins and not to have NSAIDs 48 hours prior. Patient to be NPO at midnight. Driver secured.

## 2022-06-30 ENCOUNTER — Other Ambulatory Visit: Payer: Self-pay | Admitting: Family Medicine

## 2022-06-30 NOTE — H&P (Signed)
Office Visit Report     06/08/2022   --------------------------------------------------------------------------------   Corey [name]. Frigo  MRN: [id number]  DOB: Aug 03, 1965, 57 year old Male  SSN: [id number]   PRIMARY CARE:  Carolann Littler, MD  PRIMARY CARE FAX:  [phone]  REFERRING:  Georgette Dover, MD  PROVIDER:  [name], M.D.  LOCATION:  [facility] [phone]     --------------------------------------------------------------------------------   CC/HPI: F/u -   1) kidney stones - underwent ESWL August 2016. Stone analysis showed greater than 80% uric acid. He had left flank pain 07/21 with KUB showing left renal stones and possible ueteral stone. On K citrate. CT needed prior auth. Pt then passed a stone. CT 07/21 was then done which showed mild left perinephric stranding and left hydro c/w with passed stone and a 9 mm left MP stone. Pt's pain improved. F/u 09/21 limited left [place] with no hydro and 12 mm LMP stone which was smaller on KUB.   Interested in left ESWL on the left stones. No flank pain. On KUB 03/22, persistent left 9 mm MP stone. A more lateral oval opacity appears to be in the bowel. KUB 10/22, stable LMP 9 mm stone. [place] benign - no hydro. LLP stone.   2) BPH - noted on 2021 CT and 66 g on [place] 09/21. He has no bothersome LUTS. 11/20 PSA 2.38. Did have frequency but this settled down.   Today, seen for the above. No gross hematuria or flank pain. No stone passage. KUB earlier this month Jan 2024 with 4 mm RLP stone and a 16 mm LMP stone. Normal bone and bowel gas pattern. [place] with Several calcs noted in the right and left kidney and a small cyst in the right. bladder 103 ml. The prostate appears slightly enlarged at 5.81x4.39x4.88cm = 60. AUASS = 1.   [place] images reviewed and agree. Marland Kitchen PSA was 2.1 in Jan 2023. Cr 1.15.     ALLERGIES: Hydrocodone Naproxen TABS    MEDICATIONS: Atorvastatin Calcium 10 MG Oral Tablet Oral  Benazepril HCl - 20  MG Oral Tablet Oral  Fish Oil CAPS Oral  Multi-Vitamin TABS Oral  Potassium Citrate     GU PSH: ESWL - 2016       PSH Notes: Lithotripsy, Oral Surgery   NON-GU PSH: Colonoscopy     GU PMH: BPH w/o LUTS - 05/18/2022, - 03/12/2021, Check PSA. Dre benign. , - 07/27/2020, PSA was low last year. , - 01/27/2020 History of urolithiasis - 05/18/2022, History of renal calculi, - 2016 Renal calculus, Doing well - see in 1 year or sooner if needed. - 03/12/2021, Disc surv vs treatment and he will continue surveillance. , - 07/27/2020, The left renal stone on KUB today measures about 6 mm. I would recommend surveillance but we discussed left ESWL. Also discussed low urine pH and nature r/b/a of K citrate. , - 01/27/2020, - 2017, Nephrolithiasis, - 2016 Renal and ureteral calculus - 2021 Hydronephrosis Unspec - 2017 Urinary Tract Inf, Unspec site, UTI (urinary tract infection), bacterial - 2016 LLQ pain, Abdominal pain, LLQ (left lower quadrant) - 2016 Ureteral calculus, Calculus of ureter - 2016    NON-GU PMH: Encounter for general adult medical examination without abnormal findings, Encounter for preventive health examination - 2016 Anxiety, Anxiety (Symptom) - 2014 Personal history of other diseases of the circulatory system, History of hypertension - 2014 Personal history of other diseases of the digestive system, History of esophageal reflux - 2014 Personal  history of other endocrine, nutritional and metabolic disease, History of hypercholesterolemia - 2014 Personal history of other mental and behavioral disorders, History of depression - 2014 Depression GERD Hypercholesterolemia Hypertension    FAMILY HISTORY: Prostate Cancer - Grandfather   SOCIAL HISTORY: Marital Status: Single Preferred Language: English; Ethnicity: Not Hispanic Or Latino; Race: White Current Smoking Status: Patient has never smoked.  Drinks 1 drink per week.  Drinks 3 caffeinated drinks per day. Patient's occupation  is/was [employer].     Notes: Never A Smoker, Marital History - Single, Alcohol Use, Occupation:, Caffeine Use   REVIEW OF SYSTEMS:    GU Review Male:   Patient denies frequent urination, hard to postpone urination, burning/ pain with urination, get up at night to urinate, leakage of urine, stream starts and stops, trouble starting your stream, have to strain to urinate , erection problems, and penile pain.  Gastrointestinal (Upper):   Patient denies nausea, vomiting, and indigestion/ heartburn.  Gastrointestinal (Lower):   Patient denies diarrhea and constipation.  Constitutional:   Patient denies fever, night sweats, weight loss, and fatigue.  Skin:   Patient denies skin rash/ lesion and itching.  Eyes:   Patient denies blurred vision and double vision.  Ears/ Nose/ Throat:   Patient denies sore throat and sinus problems.  Hematologic/Lymphatic:   Patient denies swollen glands and easy bruising.  Cardiovascular:   Patient denies leg swelling and chest pains.  Respiratory:   Patient denies cough and shortness of breath.  Endocrine:   Patient denies excessive thirst.  Musculoskeletal:   Patient denies back pain and joint pain.  Neurological:   Patient denies headaches and dizziness.  Psychologic:   Patient denies depression and anxiety.   VITAL SIGNS: None   MULTI-SYSTEM PHYSICAL EXAMINATION:    Constitutional: Well-nourished. No physical deformities. Normally developed. Good grooming.  Neck: Neck symmetrical, not swollen. Normal tracheal position.  Respiratory: No labored breathing, no use of accessory muscles.   Cardiovascular: Normal temperature, normal extremity pulses, no swelling, no varicosities.  Skin: No paleness, no jaundice, no cyanosis. No lesion, no ulcer, no rash.  Neurologic / Psychiatric: Oriented to time, oriented to place, oriented to person. No depression, no anxiety, no agitation.  Gastrointestinal: No mass, no tenderness, no rigidity, non obese abdomen.      Complexity of Data:  X-Ray Review: KUB: Reviewed Films. Discussed With Patient. 2021 and 2022 C.T. Abdomen/Pelvis: Reviewed Films. Discussed With Patient. 2021 C.T. Chest: Discussed With Patient.     07/27/20  PSA  Total PSA 1.78 ng/mL    PROCEDURES:          Urinalysis w/Scope Dipstick Dipstick Cont'd Micro  Color: Yellow Bilirubin: Neg mg/dL WBC/hpf: NS (Not Seen)  Appearance: Clear Ketones: Neg mg/dL RBC/hpf: 0 - 2/hpf  Specific Gravity: 1.010 Blood: Trace ery/uL Bacteria: NS (Not Seen)  pH: 6.0 Protein: Neg mg/dL Cystals: NS (Not Seen)  Glucose: Neg mg/dL Urobilinogen: 0.2 mg/dL Casts: NS (Not Seen)    Nitrites: Neg Trichomonas: Not Present    Leukocyte Esterase: Neg leu/uL Mucous: Not Present      Epithelial Cells: NS (Not Seen)      Yeast: NS (Not Seen)      Sperm: Not Present    ASSESSMENT:      ICD-10 Details  1 GU:   Renal calculus - N20.0 Self-Limited - Disc left stone growth and the patient the nature risks and benefits of continued stone passage, off label use of alpha blockers, shockwave lithotripsy or ureteroscopy. All questions answered.  He did well with ESWL before and wants to proceed again. Disc poss staged procedure and a colleague might be doing the procedure.    2   BPH w/o LUTS - N40.0 Chronic, Stable - disc US findings. Cont surveillance. PSA soon with Dr. Jacinto Reap.    PLAN:           Schedule Return Visit/Planned Activity: 1 Year - Office Visit, Follow up MD          Document Letter(s):  Created for Patient: Clinical Summary         Notes:   cc: Dr. Elease Hashimoto     * Signed by [name], M.D. on 06/08/22 at 5:10 PM (EST)*      The information contained in this medical record document is considered private and confidential patient information. This information can only be used for the medical diagnosis and/or medical services that are being provided by the patient's selected caregivers. This information can only be distributed outside of the  patient's care if the patient agrees and signs waivers of authorization for this information to be sent to an outside source or route.

## 2022-07-01 ENCOUNTER — Encounter (HOSPITAL_BASED_OUTPATIENT_CLINIC_OR_DEPARTMENT_OTHER): Payer: Self-pay | Admitting: Urology

## 2022-07-01 ENCOUNTER — Encounter (HOSPITAL_BASED_OUTPATIENT_CLINIC_OR_DEPARTMENT_OTHER)
Admission: RE | Disposition: A | Discharge status: Home / Self Care | Payer: Self-pay | Source: Ambulatory Visit | Attending: Urology

## 2022-07-01 ENCOUNTER — Ambulatory Visit (HOSPITAL_COMMUNITY): Payer: 59

## 2022-07-01 ENCOUNTER — Other Ambulatory Visit: Payer: Self-pay

## 2022-07-01 ENCOUNTER — Ambulatory Visit (HOSPITAL_BASED_OUTPATIENT_CLINIC_OR_DEPARTMENT_OTHER)
Admission: RE | Admit: 2022-07-01 | Discharge: 2022-07-01 | Disposition: A | Discharge status: Home / Self Care | Payer: 59 | Source: Ambulatory Visit | Attending: Urology | Admitting: Urology

## 2022-07-01 DIAGNOSIS — I878 Other specified disorders of veins: Secondary | ICD-10-CM | POA: Diagnosis not present

## 2022-07-01 DIAGNOSIS — N200 Calculus of kidney: Secondary | ICD-10-CM | POA: Insufficient documentation

## 2022-07-01 DIAGNOSIS — F32A Depression, unspecified: Secondary | ICD-10-CM | POA: Diagnosis not present

## 2022-07-01 DIAGNOSIS — I10 Essential (primary) hypertension: Secondary | ICD-10-CM | POA: Diagnosis not present

## 2022-07-01 DIAGNOSIS — E669 Obesity, unspecified: Secondary | ICD-10-CM | POA: Diagnosis not present

## 2022-07-01 DIAGNOSIS — R69 Illness, unspecified: Secondary | ICD-10-CM | POA: Diagnosis not present

## 2022-07-01 DIAGNOSIS — K219 Gastro-esophageal reflux disease without esophagitis: Secondary | ICD-10-CM | POA: Insufficient documentation

## 2022-07-01 HISTORY — PX: EXTRACORPOREAL SHOCK WAVE LITHOTRIPSY: SHX1557

## 2022-07-01 SURGERY — LITHOTRIPSY, ESWL
Anesthesia: LOCAL | Laterality: Left

## 2022-07-01 MED ORDER — DIAZEPAM 5 MG PO TABS
ORAL_TABLET | ORAL | Status: AC
  Filled 2022-07-01: qty 2

## 2022-07-01 MED ORDER — ONDANSETRON 8 MG PO TBDP: 8.0000 mg | ORAL_TABLET | Freq: Three times a day (TID) | ORAL | 0 refills | Status: DC | PRN

## 2022-07-01 MED ORDER — DIPHENHYDRAMINE HCL 25 MG PO CAPS
25.0000 mg | ORAL_CAPSULE | ORAL | Status: AC
  Administered 2022-07-01: 25 mg via ORAL

## 2022-07-01 MED ORDER — DIPHENHYDRAMINE HCL 25 MG PO CAPS
ORAL_CAPSULE | ORAL | Status: AC
  Filled 2022-07-01: qty 1

## 2022-07-01 MED ORDER — DIAZEPAM 5 MG PO TABS
10.0000 mg | ORAL_TABLET | ORAL | Status: AC
  Administered 2022-07-01: 10 mg via ORAL

## 2022-07-01 MED ORDER — SODIUM CHLORIDE 0.9 % IV SOLN
INTRAVENOUS | Status: DC
  Administered 2022-07-01: 1000 mL via INTRAVENOUS

## 2022-07-01 MED ORDER — CIPROFLOXACIN HCL 500 MG PO TABS
500.0000 mg | ORAL_TABLET | ORAL | Status: AC
  Administered 2022-07-01: 500 mg via ORAL

## 2022-07-01 MED ORDER — CIPROFLOXACIN HCL 500 MG PO TABS
ORAL_TABLET | ORAL | Status: AC
  Filled 2022-07-01: qty 1

## 2022-07-01 MED ORDER — HYDROCODONE-ACETAMINOPHEN 7.5-325 MG PO TABS: 1.0000 | ORAL_TABLET | Freq: Four times a day (QID) | ORAL | 0 refills | Status: DC | PRN

## 2022-07-01 NOTE — Interval H&P Note (Signed)
History and Physical Interval Note:  07/01/2022 7:51 AM  Corey [name]  has presented today for surgery, with the diagnosis of LEFT RENAL STONE.  The various methods of treatment have been discussed with the patient and family. After consideration of risks, benefits and other options for treatment, the patient has consented to  Procedure(s): LEFT EXTRACORPOREAL SHOCK WAVE LITHOTRIPSY (ESWL) (Left) as a surgical intervention.  The patient's history has been reviewed, patient examined, no change in status, stable for surgery.  I have reviewed the patient's chart and labs.  18 mm LMP stone on KUB. Sy is well. No fever or congestion. No sore throat. No dysuria or gross hematuria. Questions were answered to the patient's satisfaction. Discussed he may need a staged procedure.    [name]

## 2022-07-01 NOTE — Op Note (Signed)
Left ESWL   18 mm Left mid pole stone - see P[facility] full op note  Findings: stone fragmented well but there was one dense fragment. We slow the rate. Excellent targeting. He may need a staged procedure.

## 2022-07-04 ENCOUNTER — Encounter (HOSPITAL_BASED_OUTPATIENT_CLINIC_OR_DEPARTMENT_OTHER): Payer: Self-pay | Admitting: Urology

## 2022-07-15 DIAGNOSIS — N200 Calculus of kidney: Secondary | ICD-10-CM | POA: Diagnosis not present

## 2022-08-29 DIAGNOSIS — N200 Calculus of kidney: Secondary | ICD-10-CM | POA: Diagnosis not present

## 2022-09-05 DIAGNOSIS — K6282 Dysplasia of anus: Secondary | ICD-10-CM | POA: Diagnosis not present

## 2022-09-05 DIAGNOSIS — K641 Second degree hemorrhoids: Secondary | ICD-10-CM | POA: Diagnosis not present

## 2022-09-05 DIAGNOSIS — Z86010 Personal history of colonic polyps: Secondary | ICD-10-CM | POA: Diagnosis not present

## 2022-09-28 DIAGNOSIS — H40013 Open angle with borderline findings, low risk, bilateral: Secondary | ICD-10-CM | POA: Diagnosis not present

## 2022-11-09 ENCOUNTER — Other Ambulatory Visit: Payer: Self-pay | Admitting: Family Medicine

## 2022-11-26 IMAGING — CT CT CARDIAC CORONARY ARTERY CALCIUM SCORE
3 series · 14 of 20 positions shown, 16 images · non-contrast
Comparison: None.
COMPARISON: None.

Addendum:
EXAM:
OVER-READ INTERPRETATION  CT CHEST

The following report is an over-read performed by radiologist Dr.
Rogerio Costa Laco [REDACTED] on 06/21/2021. This
over-read does not include interpretation of cardiac or coronary
anatomy or pathology. The coronary calcium score interpretation by
the cardiologist is attached.
CLINICAL DATA: Cardiovascular Disease Risk stratification
Coronary Calcium Score
TECHNIQUE: A gated, non-contrast computed tomography scan of the heart was
performed using 3mm slice thickness. Axial images were analyzed on a
dedicated workstation. Calcium scoring of the coronary arteries was
performed using the Agatston method.

[Series 2: cascseq 2.0 sa36 70% (id) · axial · 0.45mm/px · z∈[+1269,+1359]mm · 4 of 76 slices shown]
[im 16/76  vessel]
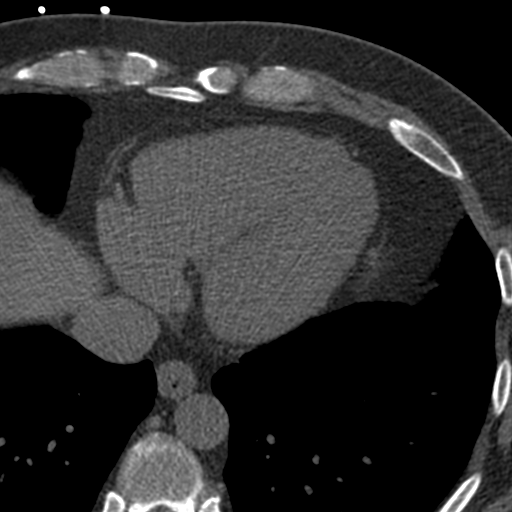
[im 31/76  vessel]
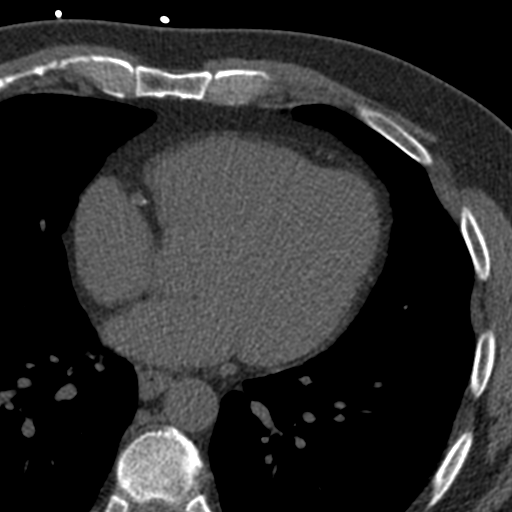
[im 46/76  vessel]
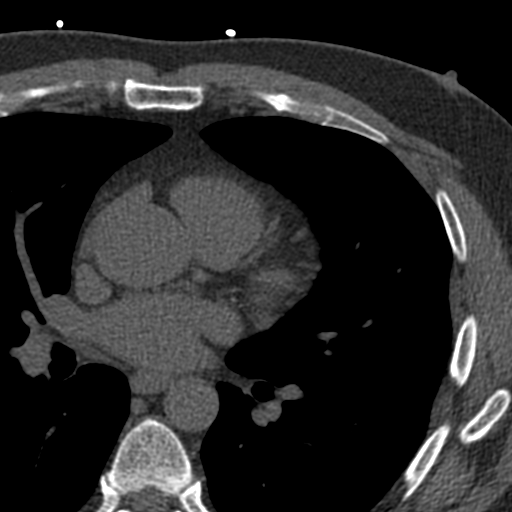
[im 61/76  vessel]
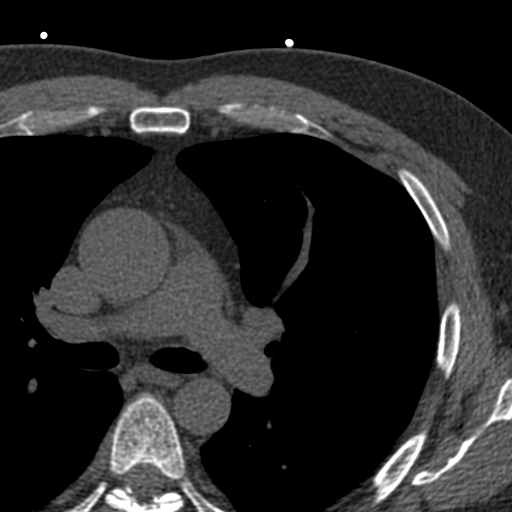

[Series 3: cascseq 2.0 bf37 st · axial · 0.77mm/px · z∈[+1263,+1363]mm · 5 of 76 slices shown, 7 images]
[im 13/76  vessel]
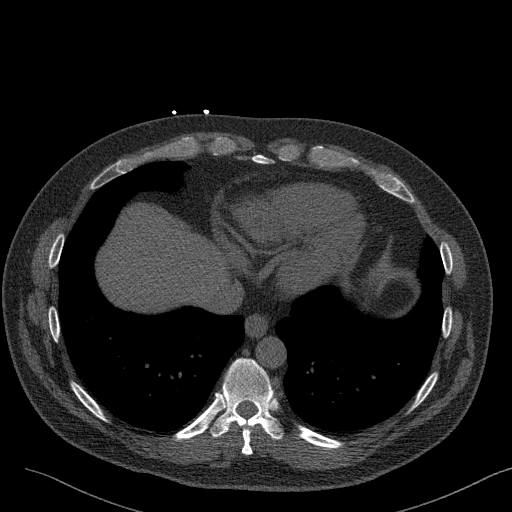
[im 13/76  lung]
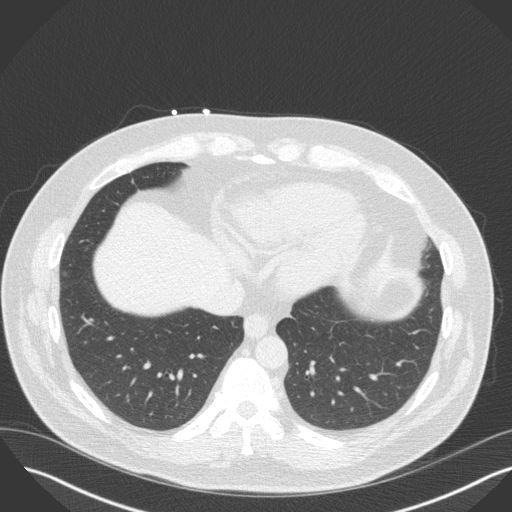
[im 26/76  vessel]
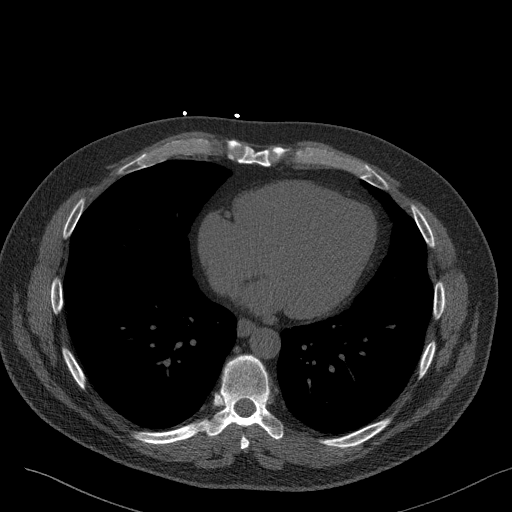
[im 38/76  vessel]
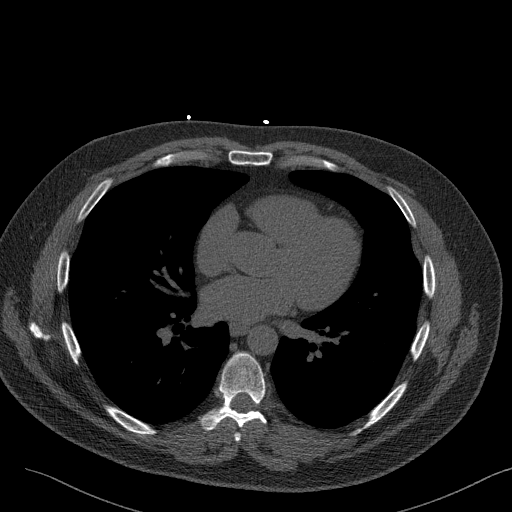
[im 51/76  vessel]
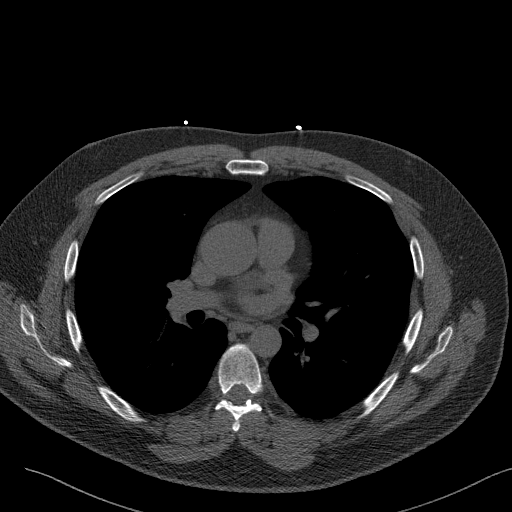
[im 63/76  vessel]
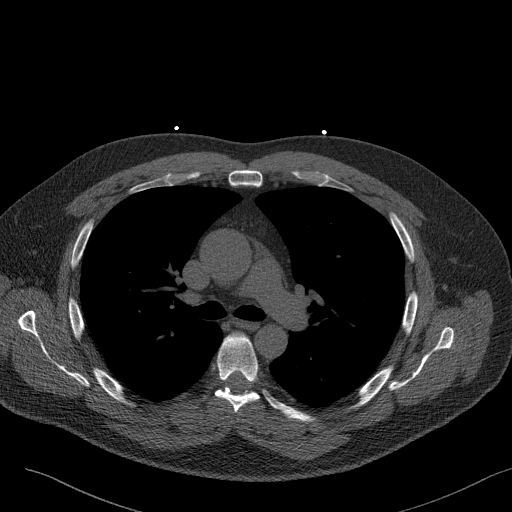
[im 63/76  lung]
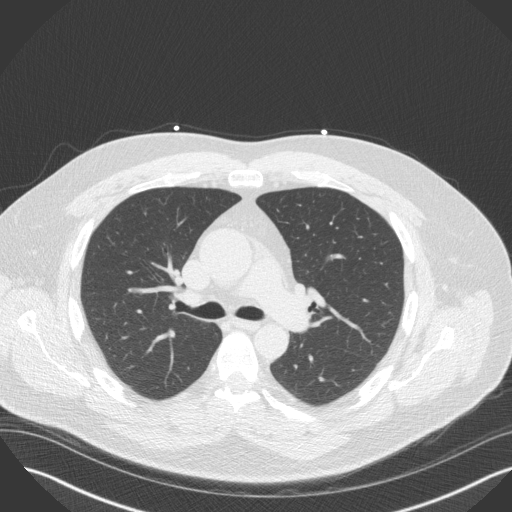

[Series 4: cascseq 2.0 br59 lung · axial · 0.77mm/px · z∈[+1263,+1363]mm · 5 of 76 slices shown]
[im 13/76  lung]
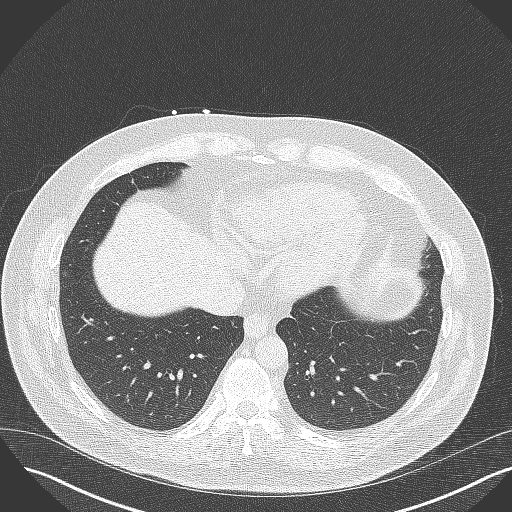
[im 26/76  lung]
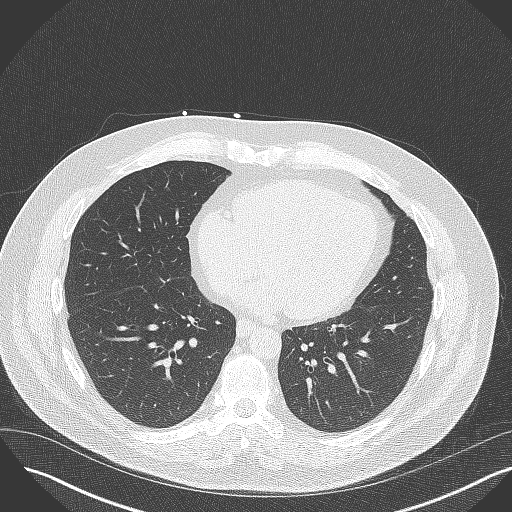
[im 38/76  lung]
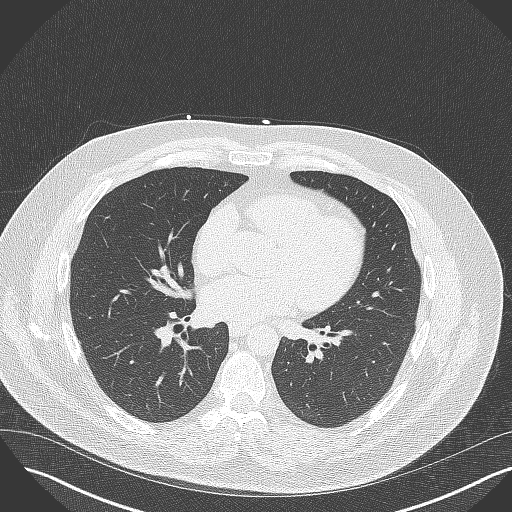
[im 51/76  lung]
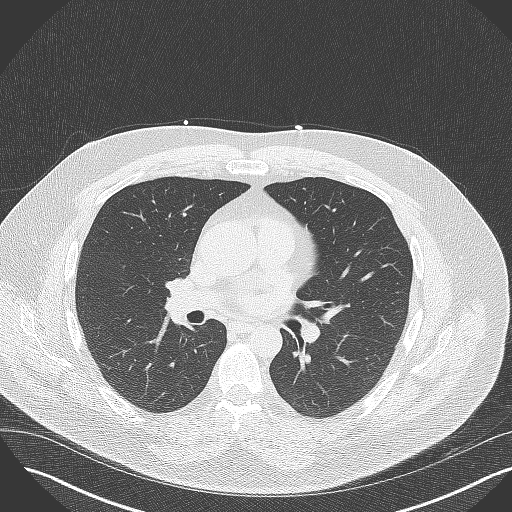
[im 63/76  lung]
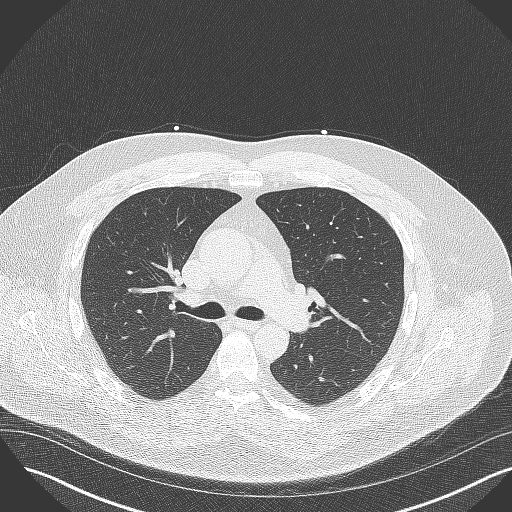

[14 of 20 positions shown; findings below may reference images not displayed]

FINDINGS: Ectasia of ascending thoracic aorta (4.4 cm in diameter). Within the
visualized portions of the thorax there are no suspicious appearing
pulmonary nodules or masses, there is no acute consolidative
airspace disease, no pleural effusions, no pneumothorax and no
lymphadenopathy. Visualized portions of the upper abdomen are
unremarkable. There are no aggressive appearing lytic or blastic
lesions noted in the visualized portions of the skeleton.
IMPRESSION: 1. Ectasia of ascending thoracic aorta (4.4 cm in diameter).
Recommend annual imaging followup by CTA or MRA. This recommendation
follows 7090 ACCF/AHA/AATS/ACR/ASA/SCA/BENCOSME/NOMASIBULELE/LLERNADEZ/BUTTNER Guidelines
for the Diagnosis and Management of Patients with Thoracic Aortic
Disease. Circulation. 7090; 121: E266-e369. Aortic aneurysm NOS
(Y8JYP-TJY.S).
FINDINGS: Coronary arteries: Normal origins.

Coronary Calcium Score:

Left main: 0

Left anterior descending artery: 229

Left circumflex artery:

Right coronary artery: 2,.5

Total: 281

Percentile: 93rd

Pericardium: Normal.

Ascending Aorta: Mildly dilated at 40.8mm.

Non-cardiac: See separate report from [REDACTED].
IMPRESSION: Coronary calcium score of 281. This was 93rd percentile for age-,
race-, and sex-matched controls.

Consider dedicated gated chest CTA for further assessment of dilated
aorta.



If CAC=0, it is reasonable to withhold statin therapy and reassess
in 5 to 10 years, as long as higher risk conditions are absent
(diabetes mellitus, family history of premature CHD in first degree
relatives (males <55 years; females <65 years), cigarette smoking,
or LDL >=190 mg/dL).

If CAC is 1 to 99, it is reasonable to initiate statin therapy for
patients >=55 years of age.

If CAC is >=100 or >=75th percentile, it is reasonable to initiate
statin therapy at any age.

Cardiology referral should be considered for patients with CAC
scores >=400 or >=75th percentile.

*1501 AHA/ACC/AACVPR/AAPA/ABC/GERARD/TIGER/STANI/Stive/EKO-EKO/NOURY/FAHRUTDINOVA
Guideline on the Management of Blood Cholesterol: A Report of the
American College of Cardiology/American Heart Association Task Force
on Clinical Practice Guidelines. J Am Coll Cardiol.
5822;73(24):6103-6274.

*** End of Addendum ***
EXAM:
OVER-READ INTERPRETATION  CT CHEST

The following report is an over-read performed by radiologist Dr.
Rogerio Costa Laco [REDACTED] on 06/21/2021. This
over-read does not include interpretation of cardiac or coronary
anatomy or pathology. The coronary calcium score interpretation by
the cardiologist is attached.
FINDINGS: Ectasia of ascending thoracic aorta (4.4 cm in diameter). Within the
visualized portions of the thorax there are no suspicious appearing
pulmonary nodules or masses, there is no acute consolidative
airspace disease, no pleural effusions, no pneumothorax and no
lymphadenopathy. Visualized portions of the upper abdomen are
unremarkable. There are no aggressive appearing lytic or blastic
lesions noted in the visualized portions of the skeleton.
IMPRESSION: 1. Ectasia of ascending thoracic aorta (4.4 cm in diameter).
Recommend annual imaging followup by CTA or MRA. This recommendation
follows 7090 ACCF/AHA/AATS/ACR/ASA/SCA/BENCOSME/NOMASIBULELE/LLERNADEZ/BUTTNER Guidelines
for the Diagnosis and Management of Patients with Thoracic Aortic
Disease. Circulation. 7090; 121: E266-e369. Aortic aneurysm NOS
(Y8JYP-TJY.S).

## 2022-12-01 DIAGNOSIS — R197 Diarrhea, unspecified: Secondary | ICD-10-CM | POA: Diagnosis not present

## 2022-12-06 ENCOUNTER — Other Ambulatory Visit: Payer: Self-pay | Admitting: Family Medicine

## 2023-03-24 ENCOUNTER — Encounter: Payer: Self-pay | Admitting: Family Medicine

## 2023-03-24 ENCOUNTER — Telehealth (INDEPENDENT_AMBULATORY_CARE_PROVIDER_SITE_OTHER): Payer: 59 | Admitting: Family Medicine

## 2023-03-24 DIAGNOSIS — F339 Major depressive disorder, recurrent, unspecified: Secondary | ICD-10-CM | POA: Diagnosis not present

## 2023-03-24 MED ORDER — ESCITALOPRAM OXALATE 10 MG PO TABS: 10.0000 mg | ORAL_TABLET | Freq: Every day | ORAL | 1 refills | Status: DC
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
## 2023-05-04 ENCOUNTER — Other Ambulatory Visit: Payer: Self-pay | Admitting: Family Medicine

## 2023-06-01 ENCOUNTER — Other Ambulatory Visit: Payer: Self-pay | Admitting: Family Medicine

## 2023-06-26 ENCOUNTER — Other Ambulatory Visit: Payer: Self-pay | Admitting: Family Medicine

## 2023-07-03 ENCOUNTER — Ambulatory Visit: Payer: 59 | Admitting: Family Medicine

## 2023-07-19 ENCOUNTER — Encounter: Payer: Self-pay | Admitting: Family Medicine

## 2023-07-19 ENCOUNTER — Ambulatory Visit (INDEPENDENT_AMBULATORY_CARE_PROVIDER_SITE_OTHER): Payer: 59 | Admitting: Family Medicine

## 2023-07-19 VITALS — BP 128/82 | HR 70 | Temp 97.5°F | Ht 69.69 in | Wt 214.0 lb

## 2023-07-19 DIAGNOSIS — Z23 Encounter for immunization: Secondary | ICD-10-CM

## 2023-07-19 DIAGNOSIS — Z0000 Encounter for general adult medical examination without abnormal findings: Secondary | ICD-10-CM

## 2023-07-19 DIAGNOSIS — R931 Abnormal findings on diagnostic imaging of heart and coronary circulation: Secondary | ICD-10-CM | POA: Diagnosis not present

## 2023-07-19 DIAGNOSIS — E785 Hyperlipidemia, unspecified: Secondary | ICD-10-CM | POA: Diagnosis not present

## 2023-07-19 LAB — CBC WITH DIFFERENTIAL/PLATELET
Basophils Absolute: 0 K/uL (ref 0.0–0.1)
Basophils Relative: 0.4 % (ref 0.0–3.0)
Eosinophils Absolute: 0.1 K/uL (ref 0.0–0.7)
Eosinophils Relative: 1.6 % (ref 0.0–5.0)
HCT: 46.6 % (ref 39.0–52.0)
Hemoglobin: 15.9 g/dL (ref 13.0–17.0)
Lymphocytes Relative: 50.4 % — ABNORMAL HIGH (ref 12.0–46.0)
Lymphs Abs: 3.3 K/uL (ref 0.7–4.0)
MCHC: 34 g/dL (ref 30.0–36.0)
MCV: 87.2 fl (ref 78.0–100.0)
Monocytes Absolute: 0.7 K/uL (ref 0.1–1.0)
Monocytes Relative: 11.3 % (ref 3.0–12.0)
Neutro Abs: 2.4 K/uL (ref 1.4–7.7)
Neutrophils Relative %: 36.3 % — ABNORMAL LOW (ref 43.0–77.0)
Platelets: 240 K/uL (ref 150.0–400.0)
RBC: 5.34 Mil/uL (ref 4.22–5.81)
RDW: 13.3 % (ref 11.5–15.5)
WBC: 6.6 K/uL (ref 4.0–10.5)

## 2023-07-19 LAB — HEPATIC FUNCTION PANEL
ALT: 38 U/L (ref 0–53)
AST: 23 U/L (ref 0–37)
Albumin: 4.3 g/dL (ref 3.5–5.2)
Alkaline Phosphatase: 89 U/L (ref 39–117)
Bilirubin, Direct: 0.1 mg/dL (ref 0.0–0.3)
Total Bilirubin: 0.6 mg/dL (ref 0.2–1.2)
Total Protein: 7.2 g/dL (ref 6.0–8.3)

## 2023-07-19 LAB — LIPID PANEL
Cholesterol: 154 mg/dL (ref 0–200)
HDL: 33.1 mg/dL — ABNORMAL LOW
LDL Cholesterol: 77 mg/dL (ref 0–99)
NonHDL: 121.01
Total CHOL/HDL Ratio: 5
Triglycerides: 218 mg/dL — ABNORMAL HIGH (ref 0.0–149.0)
VLDL: 43.6 mg/dL — ABNORMAL HIGH (ref 0.0–40.0)

## 2023-07-19 LAB — HEMOGLOBIN A1C: Hgb A1c MFr Bld: 6.2 % (ref 4.6–6.5)

## 2023-07-19 LAB — BASIC METABOLIC PANEL WITH GFR
BUN: 22 mg/dL (ref 6–23)
CO2: 27 meq/L (ref 19–32)
Calcium: 9.2 mg/dL (ref 8.4–10.5)
Chloride: 105 meq/L (ref 96–112)
Creatinine, Ser: 1.09 mg/dL (ref 0.40–1.50)
GFR: 75.66 mL/min
Glucose, Bld: 115 mg/dL — ABNORMAL HIGH (ref 70–99)
Potassium: 4.5 meq/L (ref 3.5–5.1)
Sodium: 139 meq/L (ref 135–145)

## 2023-07-19 LAB — PSA: PSA: 3.05 ng/mL (ref 0.10–4.00)

## 2023-07-19 MED ORDER — BENAZEPRIL HCL 20 MG PO TABS: 20.0000 mg | ORAL_TABLET | Freq: Every day | ORAL | 3 refills | Status: AC

## 2023-07-19 MED ORDER — ATORVASTATIN CALCIUM 40 MG PO TABS: 40.0000 mg | ORAL_TABLET | Freq: Every day | ORAL | 3 refills | Status: AC

## 2023-07-19 NOTE — Progress Notes (Signed)
 Established Patient Office Visit  Subjective   Patient ID: Corey [name], male    DOB: [birth year]  Age: 58 y.o. MRN: [id number]  Chief Complaint  Patient presents with   Annual Exam    HPI   Corey [name] is here for physical exam.  He has history of hypertension, history of colon adenomas, GERD, hyperlipidemia, history of recurrent depression, history of kidney stones.  Also has history of prediabetes range blood sugars.  Last A1c 6.1%.  He had lithotripsy last year for stones with no recurrence since then.  Has gained some weight this year but just recently started back to more regular working out.  He thinks Lexapro may be contributing to weight gain.  He feels like his depression is stable and plans to taper off.  Does have history of elevated coronary calcium score and currently on atorvastatin 40 mg daily.  Needs follow-up labs.  Health maintenance reviewed:  Health Maintenance  Topic Date Due   Colonoscopy  07/26/2028   DTaP/Tdap/Td (4 - Td or Tdap) 05/20/2031   INFLUENZA VACCINE  Completed   COVID-19 Vaccine  Completed   Hepatitis C Screening  Completed   HIV Screening  Completed   Zoster Vaccines- Shingrix  Completed   Pneumococcal Vaccine 32-75 Years old  Aged Out   HPV VACCINES  Aged Out   -He would like to consider Prevnar 20.  Family history reviewed.  His father had CAD in his 46s.  Father also had type 2 diabetes and he had some family members on his mom's side with type 2 diabetes as well.  His mother had history of multiple strokes.  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  Non-smoker.  Rare alcohol.  Past Medical History:  Diagnosis Date   Anxiety    hx of   DEPRESSION 09/08/2009   hx of   GERD 09/08/2009   OTC PRN-with certain foods-family history of GERD   HYPERLIPIDEMIA 09/08/2009   on meds   HYPERTENSION  09/08/2009   on meds   Kidney stones    NEPHROLITHIASIS, HX OF 09/08/2009   PREDIABETES 09/08/2009   Past Surgical History:  Procedure Laterality Date   COLONOSCOPY  07/2017   SA-MAC-suprep (adeq)-tics/polyps   CYSTOSCOPY W/ URETERAL STENT PLACEMENT     EXTRACORPOREAL SHOCK WAVE LITHOTRIPSY Left 07/01/2022   Procedure: LEFT EXTRACORPOREAL SHOCK WAVE LITHOTRIPSY (ESWL);  Surgeon: Jerilee Field, MD;  Location: [facility];  Service: Urology;  Laterality: Left;   LITHOTRIPSY     WISDOM TOOTH EXTRACTION  1987    reports that he has never smoked. He has never used smokeless tobacco. He reports current alcohol use of about 1.0 standard drink of alcohol per week. He reports that he does not use drugs. family history includes Colon polyps (age of onset: 50) in his father and mother; Dementia in his mother; Diabetes in his father; Heart disease (age of onset: 30) in his father; Stroke in his mother. Allergies  Allergen Reactions   Naproxen     Disoriented per patient   Percocet [Oxycodone-Acetaminophen]     Messes with his mind per pt    Review of Systems  Constitutional:  Negative for chills, fever and malaise/fatigue.  Eyes:  Negative for blurred vision.  Respiratory:  Negative for shortness of breath.   Cardiovascular:  Negative for chest pain.  Gastrointestinal:  Negative for abdominal pain.  Genitourinary:  Negative for dysuria.  Neurological:  Negative for dizziness, weakness and headaches.      Objective:     BP 128/82 (BP Location: Left Arm, Cuff Size: Normal)   Pulse 70   Temp (!) 97.5 F (36.4 C) (Oral)   Ht 5' 9.69" (1.77 m)   Wt 214 lb (97.1 kg)   SpO2 96%   BMI 30.98 kg/m  BP Readings from Last 3 Encounters:  07/19/23 128/82  07/01/22 (!) 132/95  06/17/22 118/80   Wt Readings from Last 3 Encounters:  07/19/23 214 lb (97.1 kg)  07/01/22 201 lb (91.2 kg)  06/17/22 199 lb 8 oz (90.5 kg)      Physical Exam Vitals reviewed.   Constitutional:      General: He is not in acute distress.    Appearance: He is well-developed. He is not ill-appearing.  HENT:     Head: Normocephalic and atraumatic.     Right Ear: External ear normal.     Left Ear: External ear normal.  Eyes:     Conjunctiva/sclera: Conjunctivae normal.     Pupils: Pupils are equal, round, and reactive to light.  Neck:     Thyroid: No thyromegaly.  Cardiovascular:     Rate and Rhythm: Normal rate and regular rhythm.     Heart sounds: Normal heart sounds. No murmur heard. Pulmonary:     Effort: No respiratory distress.     Breath sounds: No wheezing or rales.  Abdominal:     General: Bowel sounds are normal. There is no distension.     Palpations: Abdomen is soft. There is no mass.     Tenderness: There is no abdominal tenderness. There is no guarding or rebound.  Musculoskeletal:     Cervical back: Normal range of motion and neck supple.     Right lower leg: No edema.     Left lower leg: No edema.  Lymphadenopathy:     Cervical: No cervical adenopathy.  Skin:    Findings: No rash.  Neurological:     Mental Status: He is alert and oriented to person, place, and time.     Cranial Nerves: No cranial nerve deficit.      No results found for any visits on 07/19/23.  Last CBC Lab Results  Component Value Date   WBC 8.1 06/17/2022   HGB 15.5 06/17/2022   HCT 45.5 06/17/2022   MCV 87.2 06/17/2022   MCH 30.0 05/04/2020   RDW 13.3 06/17/2022   PLT 247.0 06/17/2022   Last metabolic panel Lab Results  Component Value Date   GLUCOSE 98 06/17/2022   NA 141 06/17/2022   K 5.0 06/17/2022   CL 106 06/17/2022   CO2 27 06/17/2022   BUN 22 06/17/2022   CREATININE 1.16 06/17/2022   GFR 70.75 06/17/2022   CALCIUM 9.7 06/17/2022   PROT 7.1 06/17/2022   ALBUMIN 4.5 06/17/2022   BILITOT 0.5 06/17/2022   ALKPHOS 85 06/17/2022   AST 15 06/17/2022   ALT 25 06/17/2022   Last lipids Lab Results  Component Value Date   CHOL 162  06/17/2022   HDL 36.10 (L) 06/17/2022   LDLCALC 98 06/17/2022   LDLDIRECT 80.0 08/23/2021   TRIG 139.0 06/17/2022   CHOLHDL 4 06/17/2022   Last hemoglobin A1c Lab Results  Component Value Date   HGBA1C 6.1 06/17/2022   Last thyroid functions Lab Results  Component Value Date   TSH 1.93 06/17/2022  The 10-year ASCVD risk score (Arnett DK, et al., 2019) is: 7.5%    Assessment & Plan:   Problem List Items Addressed This Visit   None Visit Diagnoses       Physical exam    -  Primary   Relevant Orders   Basic metabolic panel   Lipid panel   CBC with Differential/Platelet   Hepatic function panel   PSA   Hemoglobin A1c     Need for pneumococcal vaccination       Relevant Orders   Pneumococcal conjugate vaccine 20-valent (Prevnar 20) (Completed)     58 year old male with chronic problems as above.  Initial blood pressure up today but did improve substantially after rest.  We discussed several health maintenance issues as follows  -Recommend Prevnar 20 and patient consents -Continue annual flu vaccine -Colonoscopy up-to-date -Shingrix already given -Tetanus up-to-date -Obtain follow-up labs.  Include A1c with prior history of prediabetes. -We did discuss possible addition of Zetia versus further titration of atorvastatin if LDL over 70 in view of his elevated coronary calcium score. -He is also challenged to try to lose some weight.  He has been exercising fairly regularly -Discussed tapering off Lexapro by going to 5 mg daily for at least couple weeks and then discontinue  No follow-ups on file.    [name], MD

## 2023-07-20 MED ORDER — EZETIMIBE 10 MG PO TABS: 10.0000 mg | ORAL_TABLET | Freq: Every day | ORAL | 0 refills | Status: DC

## 2023-07-20 NOTE — Addendum Note (Signed)
 Addended by: Christy Sartorius on: 07/20/2023 01:12 PM   Modules accepted: Orders

## 2023-08-14 ENCOUNTER — Encounter: Payer: Self-pay | Admitting: Family Medicine

## 2023-08-14 ENCOUNTER — Ambulatory Visit (INDEPENDENT_AMBULATORY_CARE_PROVIDER_SITE_OTHER): Admitting: Family Medicine

## 2023-08-14 VITALS — BP 132/90 | HR 87 | Temp 98.7°F | Wt 215.6 lb

## 2023-08-14 DIAGNOSIS — R051 Acute cough: Secondary | ICD-10-CM | POA: Diagnosis not present

## 2023-08-14 DIAGNOSIS — R062 Wheezing: Secondary | ICD-10-CM

## 2023-08-14 MED ORDER — PREDNISONE 20 MG PO TABS: ORAL_TABLET | ORAL | 0 refills | Status: DC

## 2023-08-14 MED ORDER — ALBUTEROL SULFATE HFA 108 (90 BASE) MCG/ACT IN AERS: 2.0000 | INHALATION_SPRAY | Freq: Four times a day (QID) | RESPIRATORY_TRACT | 0 refills | Status: AC | PRN

## 2023-08-14 NOTE — Progress Notes (Signed)
 Established Patient Office Visit  Subjective   Patient ID: Corey [name], male    DOB: [birth year]  Age: 58 y.o. MRN: [id number]  Chief Complaint  Patient presents with   Cough    Patient complains of non-productive cough, x1 week, Tried Mucinex    Wheezing    Patient complains of wheezing, x1 week    HPI   Corey [name] is seen with onset last week of some cough and feels like he has some residual wheezing now.  He felt he got worse over the weekend particularly Friday and Saturday.  May have had some low-grade fever Saturday but none since then.  Cough is mostly nonproductive.  No sore throat.  No localizing sinus pain. Non-smoker.  No history of asthma  Past Medical History:  Diagnosis Date   Anxiety    hx of   DEPRESSION 09/08/2009   hx of   GERD 09/08/2009   OTC PRN-with certain foods-family history of GERD   HYPERLIPIDEMIA 09/08/2009   on meds   HYPERTENSION 09/08/2009   on meds   Kidney stones    NEPHROLITHIASIS, HX OF 09/08/2009   PREDIABETES 09/08/2009   Past Surgical History:  Procedure Laterality Date   COLONOSCOPY  07/2017   SA-MAC-suprep (adeq)-tics/polyps   CYSTOSCOPY W/ URETERAL STENT PLACEMENT     EXTRACORPOREAL SHOCK WAVE LITHOTRIPSY Left 07/01/2022   Procedure: LEFT EXTRACORPOREAL SHOCK WAVE LITHOTRIPSY (ESWL);  Surgeon: Jerilee Field, MD;  Location: [facility];  Service: Urology;  Laterality: Left;   LITHOTRIPSY     WISDOM TOOTH EXTRACTION  1987    reports that he has never smoked. He has never used smokeless tobacco. He reports current alcohol use of about 1.0 standard drink of alcohol per week. He reports that he does not use drugs. family history includes Colon polyps (age of onset: 39) in his father and mother; Dementia in his mother; Diabetes in his father; Heart disease (age of onset: 24) in his father; Stroke in his mother. Allergies  Allergen Reactions   Naproxen     Disoriented per patient   Percocet [Oxycodone-Acetaminophen]      Messes with his mind per pt    Review of Systems  Constitutional:  Negative for chills and fever.  HENT:  Negative for sinus pain.   Respiratory:  Positive for cough and wheezing. Negative for shortness of breath.       Objective:     BP (!) 132/90 (BP Location: Left Arm, Cuff Size: Normal)   Pulse 87   Temp 98.7 F (37.1 C) (Oral)   Wt 215 lb 9.6 oz (97.8 kg)   SpO2 96%   BMI 31.22 kg/m  BP Readings from Last 3 Encounters:  08/14/23 (!) 132/90  07/19/23 128/82  07/01/22 (!) 132/95   Wt Readings from Last 3 Encounters:  08/14/23 215 lb 9.6 oz (97.8 kg)  07/19/23 214 lb (97.1 kg)  07/01/22 201 lb (91.2 kg)      Physical Exam Vitals reviewed.  Constitutional:      General: He is not in acute distress.    Appearance: He is not ill-appearing.  Cardiovascular:     Rate and Rhythm: Normal rate and regular rhythm.  Pulmonary:     Effort: Pulmonary effort is normal.     Breath sounds: Wheezing present. No rales.     Comments: He has some diffuse expiratory wheezes Neurological:     Mental Status: He is alert.      No results found for  any visits on 08/14/23.    The 10-year ASCVD risk score (Arnett DK, et al., 2019) is: 8%    Assessment & Plan:   Probable recent viral URI with cough.  Now has some mild reactive airway changes on exam.  O2 sat 96% room air.  Does not have any signs or symptoms of lower respiratory infection such as fever, rales, tachycardia, etc.  -Prednisone 20 mg 2 tablets daily for 5 days -Albuterol MDI 2 puffs every 4 hours as needed for cough and wheeze -Follow-up promptly for any recurrent fever, increased shortness of breath, or other concerns  [name], MD

## 2023-09-01 ENCOUNTER — Ambulatory Visit: Payer: Self-pay

## 2023-09-01 ENCOUNTER — Ambulatory Visit
Admission: EM | Admit: 2023-09-01 | Discharge: 2023-09-01 | Disposition: A | Discharge status: Home / Self Care | Attending: Nurse Practitioner | Admitting: Nurse Practitioner

## 2023-09-01 DIAGNOSIS — L03213 Periorbital cellulitis: Secondary | ICD-10-CM | POA: Diagnosis not present

## 2023-09-01 MED ORDER — AMOXICILLIN-POT CLAVULANATE 875-125 MG PO TABS: 1.0000 | ORAL_TABLET | Freq: Two times a day (BID) | ORAL | 0 refills | Status: AC

## 2023-09-01 NOTE — ED Triage Notes (Addendum)
 [facility] presents to [facility] with co of right eye pain and swelling since Wednesday. Pt has attempted otc allergy medications. Swelling is impacting ability to open eye but able to see fine when eye is open.

## 2023-09-01 NOTE — Telephone Encounter (Signed)
  Chief Complaint: eye swelling Symptoms: swelling/watery discharge/pain  Disposition: [] ED /[x] Urgent Care (no appt availability in office) / [] Appointment(In office/virtual)/ []  [facility]/ [] Home Care/ [] Refused Recommended Disposition /[] [facility] [place] [facility]/ []  Follow-up with PCP Additional Notes: Pt called with concerns of right swollen eye. Pt stated he had a fever for the last two days, but doesn't currently. Temp ranged from 100-101. Pt stated the eye tends to stay 1/2 shut due to swelling. Pt stated eyelid is red but isn't painful. Pt does have pain in upper inside corner above eye.  Pt has tried OTC allergy meds and had some mild improvement along with using warm compresses. Pt was watery discharge. Pt stated vision hasn't been effected unless eye is drooped closed. Pt does have to "hold" it open.  RN advised pt to go to urgent care to be evaluated today to make sure there's no object in eye. Pt verbalized understanding and stated he will go to UC today. RN advised to call back if needs follow-up appt with pcp.          Copied from CRM [id number]. Topic: Clinical - Red Word Triage >> Sep 01, 2023  9:03 AM Varney Gentleman wrote: [facility] that prompted transfer to Nurse Triage: Right eye is swollen and having pain. Reason for Disposition  [1] SEVERE eyelid swelling on one side AND [2] red and painful (or tender to touch)  Answer Assessment - Initial Assessment Questions 1. ONSET: "When did the swelling start?" (e.g., minutes, hours, days)     2 days ago 2. LOCATION: "What part of the eyelids is swollen?"     Right eye 3. SEVERITY: "How swollen is it?"     1/2 swollen shut 4. ITCHING: "Is there any itching?" If Yes, ask: "How much?"   (Scale 1-10; mild, moderate or severe)     denies 5. PAIN: "Is the swelling painful to touch?" If Yes, ask: "How painful is it?"   (Scale 1-10; mild, moderate or severe)     Upper inside corner above-4  6. FEVER: "Do you have a fever?" If  Yes, ask: "What is it, how was it measured, and when did it start?"      Not currently 100.1 7. CAUSE: "What do you think is causing the swelling?"     Not sure  9. OTHER SYMPTOMS: "Do you have any other symptoms?" (e.g., blurred vision, eye discharge, rash, runny nose)     Watery clear discharge  Protocols used: Eye - Swelling-A-AH

## 2023-09-01 NOTE — ED Provider Notes (Signed)
 [facility]    CSN: [id number] Arrival date & time: 09/01/23  0932      History   Chief Complaint Chief Complaint  Patient presents with   Facial Swelling    HPI Corey [name] is a 58 y.o. male.   Subjective:  Corey [name] is a 58 year old male presents with pain and a sensation of pressure around the right eye that began two days ago. He also reports clear drainage from the eye. On the first day of symptoms, he experienced a fever with a maximum temperature of 101F, along with very mild nasal congestion and an intermittent, mild headache. He denies any trauma to the eye or exposure to chemicals. He wears glasses and reports no visual disturbances. There is no pain within the eye itself or in the face. He initially attributed his symptoms to allergies or sinus pressure. 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 He tried taking Claritin, but it did not provide any relief.  The following portions of the patient's history were reviewed and updated as appropriate: allergies, current medications, past family history, past medical history, past social history, past surgical history, and problem list       Past Medical History:  Diagnosis Date   Anxiety    hx of   DEPRESSION 09/08/2009   hx of   GERD 09/08/2009   OTC PRN-with certain foods-family history of GERD   HYPERLIPIDEMIA 09/08/2009   on meds   HYPERTENSION 09/08/2009   on meds   Kidney stones    NEPHROLITHIASIS, HX OF 09/08/2009   PREDIABETES 09/08/2009    Patient Active Problem List   Diagnosis Date Noted   Elevated coronary artery calcium  score 07/19/2023   Colon adenoma 03/25/2019   Hyperlipidemia 09/08/2009   Major depressive disorder, recurrent episode (HCC) 09/08/2009   Essential hypertension 09/08/2009   GERD 09/08/2009   Prediabetes 09/08/2009   NEPHROLITHIASIS, HX OF  09/08/2009    Past Surgical History:  Procedure Laterality Date   COLONOSCOPY  07/2017   SA-MAC-suprep (adeq)-tics/polyps   CYSTOSCOPY W/ URETERAL STENT PLACEMENT     EXTRACORPOREAL SHOCK WAVE LITHOTRIPSY Left 07/01/2022   Procedure: LEFT EXTRACORPOREAL SHOCK WAVE LITHOTRIPSY (ESWL);  Surgeon: Nieves Cough, MD;  Location[facility];  Service: Urology;  Laterality: Left;   LITHOTRIPSY     WISDOM TOOTH EXTRACTION  1987       Home Medications    Prior to Admission medications   Medication Sig Start Date End Date Taking? Authorizing Provider  amoxicillin -clavulanate (AUGMENTIN ) 875-125 MG tablet Take 1 tablet by mouth every 12 (twelve) hours. 09/01/23  Yes Iola Lukes, FNP  albuterol  (VENTOLIN  HFA) 108 (90 Base) MCG/ACT inhaler Inhale 2 puffs into the lungs every 6 (six) hours as needed for wheezing or shortness of breath. 08/14/23   Burchette, Wolm ORN, MD  atorvastatin  (LIPITOR) 40 MG tablet Take 1 tablet (40 mg total) by mouth daily. 07/19/23   Burchette, Wolm ORN, MD  benazepril  (LOTENSIN ) 20 MG tablet Take 1 tablet (20 mg total) by mouth daily. 07/19/23   Burchette, Wolm ORN, MD  ezetimibe  (ZETIA ) 10 MG tablet Take 1 tablet (10 mg total) by mouth daily. 07/20/23   Burchette, Wolm ORN, MD  Multiple Vitamin (MULTIVITAMIN WITH MINERALS) TABS Take 1 tablet by mouth daily.    [provider]  Omega-3 Fatty Acids (FISH OIL PO) Take 1 capsule by mouth daily at  6 (six) AM.    [provider]    Family History Family History  Problem Relation Age of Onset   Colon polyps Mother 40   Stroke Mother    Dementia Mother    Diabetes Father    Colon polyps Father 66   Heart disease Father 45       CAD   Hyperlipidemia Neg Hx        family hx   Colon cancer Neg Hx    Esophageal cancer Neg Hx    Rectal cancer Neg Hx    Stomach cancer Neg Hx     Social History Social History   Tobacco Use   Smoking status: Never   Smokeless tobacco: Never  Vaping  Use   Vaping status: Never Used  Substance Use Topics   Alcohol use: Yes    Alcohol/week: 1.0 standard drink of alcohol    Types: 1 Standard drinks or equivalent per week   Drug use: No     Allergies   Naproxen  and Percocet [oxycodone -acetaminophen ]   Review of Systems Review of Systems  Constitutional:  Positive for fever.  HENT:  Positive for congestion. Negative for rhinorrhea, sinus pain, sneezing and sore throat.   Eyes:  Positive for pain (pain around the left eye, no pain within the eye itself) and discharge. Negative for redness and visual disturbance.  Respiratory:  Positive for cough (residual mild cough; recent viral URI on 3/31).   Gastrointestinal:  Negative for nausea and vomiting.  Neurological:  Positive for headaches (mild, intermittent). Negative for dizziness, facial asymmetry, weakness, light-headedness and numbness.  All other systems reviewed and are negative.    Physical Exam Triage Vital Signs ED Triage Vitals  Encounter Vitals Group     BP 09/01/23 0946 (!) 157/101     Systolic BP Percentile --      Diastolic BP Percentile --      Pulse Rate 09/01/23 0946 91     Resp 09/01/23 0946 16     Temp 09/01/23 0946 98.7 F (37.1 C)     Temp src --      SpO2 09/01/23 0946 95 %     Weight --      Height --      Head Circumference --      Peak Flow --      Pain Score 09/01/23 0945 5     Pain Loc --      Pain Education --      Exclude from Growth Chart --    No data found.  Updated Vital Signs BP (!) 157/101   Pulse 91   Temp 98.7 F (37.1 C)   Resp 16   SpO2 95%   Visual Acuity Right Eye Distance:   Left Eye Distance:   Bilateral Distance:    Right Eye Near:   Left Eye Near:    Bilateral Near:     Physical Exam Vitals reviewed.  Constitutional:      General: He is not in acute distress.    Appearance: Normal appearance. He is well-developed and well-groomed. He is not ill-appearing, toxic-appearing or diaphoretic.  HENT:     Head:  Normocephalic.     Mouth/Throat:     Mouth: Mucous membranes are moist.  Eyes:     General: Lids are normal. Lids are everted, no foreign bodies appreciated. Vision grossly intact. Gaze aligned appropriately. No visual field deficit.       Right eye: No discharge.  Extraocular Movements: Extraocular movements intact.     Conjunctiva/sclera: Conjunctivae normal.     Right eye: Right conjunctiva is not injected.     Pupils: Pupils are equal, round, and reactive to light.     Visual Fields: Right eye visual fields normal and left eye visual fields normal.     Comments: Swelling of the right periorbital region with erythema.  Cardiovascular:     Rate and Rhythm: Normal rate and regular rhythm.     Heart sounds: Normal heart sounds.  Pulmonary:     Effort: Pulmonary effort is normal.     Breath sounds: Normal breath sounds.  Musculoskeletal:        General: Normal range of motion.  Skin:    General: Skin is warm and dry.  Neurological:     General: No focal deficit present.     Mental Status: He is alert and oriented to person, place, and time.  Psychiatric:        Behavior: Behavior is cooperative.      UC Treatments / Results  Labs (all labs ordered are listed, but only abnormal results are displayed) Labs Reviewed - No data to display  EKG   Radiology No results found.  Procedures Procedures (including critical care time)  Medications Ordered in UC Medications - No data to display  Initial Impression / Assessment and Plan / UC Course  I have reviewed the triage vital signs and the nursing notes.  Pertinent labs & imaging results that were available during my care of the patient were reviewed by me and considered in my medical decision making (see chart for details).    58 year old male presents with a two-day history of pain and pressure around the right eye. He reports a fever on the first day of symptoms along with intermittent mild nasal congestion and  headache. He denies any history of trauma to the eye, chemical exposure, or visual disturbances. Patient is afebrile and nontoxic on presentation. Physical exam findings are documented above and are consistent with periorbital cellulitis. There are no signs concerning for orbital cellulitis. Treatment with Augmentin  was prescribed. Supportive care measures were reviewed. The patient was advised to follow up with his primary care provider or return to urgent care if there is no improvement in symptoms within 48 hours. Emergency precautions were also discussed.  Today's evaluation has revealed no signs of a dangerous process. Discussed diagnosis with patient and/or guardian. Patient and/or guardian aware of their diagnosis, possible red flag symptoms to watch out for and need for close follow up. Patient and/or guardian understands verbal and written discharge instructions. Patient and/or guardian comfortable with plan and disposition.  Patient and/or guardian has a clear mental status at this time, good insight into illness (after discussion and teaching) and has clear judgment to make decisions regarding their care  Documentation was completed with the aid of voice recognition software. Transcription may contain typographical errors. Final Clinical Impressions(s) / UC Diagnoses   Final diagnoses:  Periorbital cellulitis of right eye     Discharge Instructions      You have been diagnosed with preseptal cellulitis, an infection of the eyelid and skin around the eye that does not affect the eye itself. It is usually caused by bacteria and often follows a sinus infection, insect bite, or skin injury. You have been prescribed antibiotics to treat the infection. Take all medications exactly as directed, even if you start to feel better. Apply warm compresses to the affected area several  times a day to help reduce swelling and discomfort. Watch for any signs that the infection may be getting worse, such as  increased redness, swelling, pain, vision changes, or if the eye becomes hard to open. If this happens, seek medical attention right away. Follow-up with your primary care provider or return here in 48 HOURS if you do not see any improvement in your symptoms.      ED Prescriptions     Medication Sig Dispense Auth. Provider   amoxicillin -clavulanate (AUGMENTIN ) 875-125 MG tablet Take 1 tablet by mouth every 12 (twelve) hours. 14 tablet Iola Lukes, FNP      PDMP not reviewed this encounter.   Iola Lukes, [place] 09/01/23 1757

## 2023-09-01 NOTE — Discharge Instructions (Addendum)
 You have been diagnosed with preseptal cellulitis, an infection of the eyelid and skin around the eye that does not affect the eye itself. It is usually caused by bacteria and often follows a sinus infection, insect bite, or skin injury. You have been prescribed antibiotics to treat the infection. Take all medications exactly as directed, even if you start to feel better. Apply warm compresses to the affected area several times a day to help reduce swelling and discomfort. Watch for any signs that the infection may be getting worse, such as increased redness, swelling, pain, vision changes, or if the eye becomes hard to open. If this happens, seek medical attention right away. Follow-up with your primary care provider or return here in 48 HOURS if you do not see any improvement in your symptoms.

## 2023-10-06 ENCOUNTER — Other Ambulatory Visit: Payer: Self-pay | Admitting: Family Medicine

## 2023-11-20 ENCOUNTER — Telehealth: Payer: Self-pay

## 2023-11-20 DIAGNOSIS — E785 Hyperlipidemia, unspecified: Secondary | ICD-10-CM

## 2023-11-20 NOTE — Addendum Note (Signed)
 Addended by: METTA KRISTEN CROME on: 11/20/2023 01:48 PM   Modules accepted: Orders

## 2023-11-20 NOTE — Telephone Encounter (Signed)
 Copied from CRM [id number]. Topic: Clinical - Request for Lab/Test Order >> Nov 20, 2023 12:26 PM Corey [name] wrote: Reason for CRM: Patient is calling in to get an order for his A1C/ cholesterol follow up

## 2023-11-20 NOTE — Telephone Encounter (Signed)
Labs placed and patient aware

## 2023-11-28 ENCOUNTER — Other Ambulatory Visit (INDEPENDENT_AMBULATORY_CARE_PROVIDER_SITE_OTHER)

## 2023-11-28 ENCOUNTER — Ambulatory Visit: Payer: Self-pay | Admitting: Family Medicine

## 2023-11-28 DIAGNOSIS — E785 Hyperlipidemia, unspecified: Secondary | ICD-10-CM

## 2023-11-28 LAB — LIPID PANEL
Cholesterol: 131 mg/dL (ref 0–200)
HDL: 29.6 mg/dL — ABNORMAL LOW (ref >39.00)
LDL Cholesterol: 69 mg/dL (ref 0–99)
NonHDL: 101.37
Total CHOL/HDL Ratio: 4
Triglycerides: 164 mg/dL — ABNORMAL HIGH (ref 0.0–149.0)
VLDL: 32.8 mg/dL (ref 0.0–40.0)

## 2023-11-28 LAB — HEMOGLOBIN A1C: Hgb A1c MFr Bld: 6.3 % (ref 4.6–6.5)

## 2023-12-28 DIAGNOSIS — N400 Enlarged prostate without lower urinary tract symptoms: Secondary | ICD-10-CM | POA: Diagnosis not present

## 2023-12-28 DIAGNOSIS — R3121 Asymptomatic microscopic hematuria: Secondary | ICD-10-CM | POA: Diagnosis not present

## 2023-12-28 DIAGNOSIS — N200 Calculus of kidney: Secondary | ICD-10-CM | POA: Diagnosis not present

## 2023-12-30 ENCOUNTER — Other Ambulatory Visit: Payer: Self-pay | Admitting: Family Medicine

## 2024-02-05 DIAGNOSIS — N200 Calculus of kidney: Secondary | ICD-10-CM | POA: Diagnosis not present

## 2024-02-05 DIAGNOSIS — R3121 Asymptomatic microscopic hematuria: Secondary | ICD-10-CM | POA: Diagnosis not present

## 2024-03-01 DIAGNOSIS — N200 Calculus of kidney: Secondary | ICD-10-CM | POA: Diagnosis not present

## 2024-03-01 DIAGNOSIS — R3121 Asymptomatic microscopic hematuria: Secondary | ICD-10-CM | POA: Diagnosis not present

## 2024-07-22 ENCOUNTER — Encounter: Payer: Self-pay | Admitting: Family Medicine
